# Patient Record
Sex: Male | Born: 1975 | Race: Black or African American | Hispanic: No | Marital: Married | Smoking: Former smoker
Health system: Southern US, Community
[De-identification: ages and names within clinical notes are randomized; demographics above are authoritative.]

## PROBLEM LIST (undated history)

## (undated) DIAGNOSIS — G4730 Sleep apnea, unspecified: Secondary | ICD-10-CM

## (undated) DIAGNOSIS — J45909 Unspecified asthma, uncomplicated: Secondary | ICD-10-CM

## (undated) DIAGNOSIS — M109 Gout, unspecified: Secondary | ICD-10-CM

## (undated) DIAGNOSIS — E785 Hyperlipidemia, unspecified: Secondary | ICD-10-CM

## (undated) HISTORY — DX: Gout, unspecified: M10.9

---

## 1998-03-13 ENCOUNTER — Emergency Department (HOSPITAL_COMMUNITY): Admission: EM | Admit: 1998-03-13 | Discharge: 1998-03-13 | Payer: Self-pay | Admitting: Emergency Medicine

## 1998-04-15 ENCOUNTER — Emergency Department (HOSPITAL_COMMUNITY): Admission: EM | Admit: 1998-04-15 | Discharge: 1998-04-15 | Payer: Self-pay | Admitting: Emergency Medicine

## 1998-10-08 ENCOUNTER — Emergency Department (HOSPITAL_COMMUNITY): Admission: EM | Admit: 1998-10-08 | Discharge: 1998-10-08 | Payer: Self-pay | Admitting: Emergency Medicine

## 1998-10-08 ENCOUNTER — Encounter: Payer: Self-pay | Admitting: Emergency Medicine

## 2010-09-05 ENCOUNTER — Emergency Department: Payer: Self-pay | Admitting: Emergency Medicine

## 2015-05-05 ENCOUNTER — Encounter (HOSPITAL_COMMUNITY): Payer: Self-pay | Admitting: *Deleted

## 2015-05-05 ENCOUNTER — Emergency Department (HOSPITAL_COMMUNITY)
Admission: EM | Admit: 2015-05-05 | Discharge: 2015-05-05 | Disposition: A | Discharge status: Home / Self Care | Payer: Self-pay | Attending: Emergency Medicine | Admitting: Emergency Medicine

## 2015-05-05 ENCOUNTER — Emergency Department (HOSPITAL_COMMUNITY): Payer: Self-pay

## 2015-05-05 DIAGNOSIS — Z7982 Long term (current) use of aspirin: Secondary | ICD-10-CM | POA: Insufficient documentation

## 2015-05-05 DIAGNOSIS — R079 Chest pain, unspecified: Secondary | ICD-10-CM | POA: Insufficient documentation

## 2015-05-05 DIAGNOSIS — R002 Palpitations: Secondary | ICD-10-CM | POA: Insufficient documentation

## 2015-05-05 DIAGNOSIS — Z8719 Personal history of other diseases of the digestive system: Secondary | ICD-10-CM | POA: Insufficient documentation

## 2015-05-05 DIAGNOSIS — R000 Tachycardia, unspecified: Secondary | ICD-10-CM | POA: Insufficient documentation

## 2015-05-05 DIAGNOSIS — Z87891 Personal history of nicotine dependence: Secondary | ICD-10-CM | POA: Insufficient documentation

## 2015-05-05 DIAGNOSIS — Z8639 Personal history of other endocrine, nutritional and metabolic disease: Secondary | ICD-10-CM | POA: Insufficient documentation

## 2015-05-05 DIAGNOSIS — E785 Hyperlipidemia, unspecified: Secondary | ICD-10-CM | POA: Insufficient documentation

## 2015-05-05 HISTORY — DX: Hyperlipidemia, unspecified: E78.5

## 2015-05-05 LAB — I-STAT TROPONIN, ED
TROPONIN I, POC: 0 ng/mL (ref 0.00–0.08)
Troponin i, poc: 0 ng/mL (ref 0.00–0.08)

## 2015-05-05 LAB — BASIC METABOLIC PANEL
Anion gap: 10 (ref 5–15)
BUN: 13 mg/dL (ref 6–20)
CO2: 25 mmol/L (ref 22–32)
Calcium: 9.1 mg/dL (ref 8.9–10.3)
Chloride: 104 mmol/L (ref 101–111)
Creatinine, Ser: 1.05 mg/dL (ref 0.61–1.24)
GFR calc Af Amer: >60 mL/min (ref >60)
GFR calc non Af Amer: >60 mL/min (ref >60)
Glucose, Bld: 91 mg/dL (ref 65–99)
Potassium: 4.1 mmol/L (ref 3.5–5.1)
Sodium: 139 mmol/L (ref 135–145)

## 2015-05-05 LAB — CBC
HCT: 43.3 % (ref 39.0–52.0)
Hemoglobin: 14.3 g/dL (ref 13.0–17.0)
MCH: 30.8 pg (ref 26.0–34.0)
MCHC: 33 g/dL (ref 30.0–36.0)
MCV: 93.3 fL (ref 78.0–100.0)
Platelets: 239 10*3/uL (ref 150–400)
RBC: 4.64 MIL/uL (ref 4.22–5.81)
RDW: 13 % (ref 11.5–15.5)
WBC: 8.5 10*3/uL (ref 4.0–10.5)

## 2015-05-05 LAB — D-DIMER, QUANTITATIVE (NOT AT ARMC)

## 2015-05-05 NOTE — Discharge Instructions (Signed)

## 2015-05-05 NOTE — ED Notes (Signed)
Pt verbalized understanding of d/c instructions and has no further questions. Pt stable and NAD.  
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
## 2015-05-05 NOTE — ED Provider Notes (Signed)
CSN: [id number]     Arrival date & time 05/05/15  1717 History   First MD Initiated Contact with Patient 05/05/15 1725     Chief Complaint  Patient presents with  . Chest Pain     (Consider location/radiation/quality/duration/timing/severity/associated sxs/prior Treatment) HPI Comments: Patient presents to the ED with a chief complaint of heart palpitations.  States that he had an episode of brief palpitations yesterday.  States that they returned today and were accompanied with some mild left sided chest pain.  He denies any SOB, radiating pain, or diaphoresis.  Denies fevers, chills, or cough.  Denies hx of PE/DVT or ACS.  There are no aggravating or alleviating factors.  Symptoms came on randomly.  The history is provided by the patient. No language interpreter was used.    Past Medical History  Diagnosis Date  . Hyperlipidemia    History reviewed. No pertinent past surgical history. History reviewed. No pertinent family history. Social History  Substance Use Topics  . Smoking status: Former Smoker    Quit date: 05/04/2014  . Smokeless tobacco: Never Used  . Alcohol Use: Yes     Comment: last drink 3-4 days ago    Review of Systems  Constitutional: Negative for fever and chills.  Respiratory: Negative for shortness of breath.   Cardiovascular: Positive for chest pain and palpitations.  Gastrointestinal: Negative for nausea, vomiting, diarrhea and constipation.  Genitourinary: Negative for dysuria.  All other systems reviewed and are negative.     Allergies  Review of patient's allergies indicates no known allergies.  Home Medications   Prior to Admission medications   Medication Sig Start Date End Date Taking? Authorizing Provider  aspirin 325 MG tablet Take 325 mg by mouth every 6 (six) hours as needed for moderate pain.   Yes Historical Provider, MD  colchicine 0.6 MG tablet Take 0.6 mg by mouth every other day.   Yes Historical Provider, MD  ibuprofen  (ADVIL,MOTRIN) 200 MG tablet Take 600 mg by mouth every 6 (six) hours as needed for moderate pain.   Yes Historical Provider, MD   BP 107/59 mmHg  Pulse 82  Temp(Src) 97.9 F (36.6 C) (Oral)  Resp 14  SpO2 97% Physical Exam  Constitutional: He is oriented to person, place, and time. He appears well-developed and well-nourished.  HENT:  Head: Normocephalic and atraumatic.  Eyes: Conjunctivae and EOM are normal. Pupils are equal, round, and reactive to light. Right eye exhibits no discharge. Left eye exhibits no discharge. No scleral icterus.  Neck: Normal range of motion. Neck supple. No JVD present.  Cardiovascular: Normal rate, regular rhythm and normal heart sounds.  Exam reveals no gallop and no friction rub.   No murmur heard. Pulmonary/Chest: Effort normal and breath sounds normal. No respiratory distress. He has no wheezes. He has no rales. He exhibits no tenderness.  Abdominal: Soft. He exhibits no distension and no mass. There is no tenderness. There is no rebound and no guarding.  Musculoskeletal: Normal range of motion. He exhibits no edema or tenderness.  Neurological: He is alert and oriented to person, place, and time.  Skin: Skin is warm and dry.  Psychiatric: He has a normal mood and affect. His behavior is normal. Judgment and thought content normal.  Nursing note and vitals reviewed.   ED Course  Procedures (including critical care time) Results for orders placed or performed during the hospital encounter of 05/05/15  CBC  Result Value Ref Range   WBC 8.5 4.0 - 10.5  K/uL   RBC 4.64 4.22 - 5.81 MIL/uL   Hemoglobin 14.3 13.0 - 17.0 g/dL   HCT 16.143.3 09.639.0 - 04.552.0 %   MCV 93.3 78.0 - 100.0 fL   MCH 30.8 26.0 - 34.0 pg   MCHC 33.0 30.0 - 36.0 g/dL   RDW 40.913.0 81.111.5 - 91.415.5 %   Platelets 239 150 - 400 K/uL  Basic metabolic panel  Result Value Ref Range   Sodium 139 135 - 145 mmol/L   Potassium 4.1 3.5 - 5.1 mmol/L   Chloride 104 101 - 111 mmol/L   CO2 25 22 - 32  mmol/L   Glucose, Bld 91 65 - 99 mg/dL   BUN 13 6 - 20 mg/dL   Creatinine, Ser 7.821.05 0.61 - 1.24 mg/dL   Calcium 9.1 8.9 - 95.610.3 mg/dL   GFR calc non Af Amer >60 >60 mL/min   GFR calc Af Amer >60 >60 mL/min   Anion gap 10 5 - 15  D-dimer, quantitative (not at [facility])  Result Value Ref Range   D-Dimer, Quant <0.27 0.00 - 0.50 ug/mL-FEU  I-stat troponin, ED  Result Value Ref Range   Troponin i, poc 0.00 0.00 - 0.08 ng/mL   Comment 3          I-stat troponin, ED  Result Value Ref Range   Troponin i, poc 0.00 0.00 - 0.08 ng/mL   Comment 3           Dg Chest 2 View  05/05/2015  CLINICAL DATA:  Left side chest pain, shortness of breath, history of asthma EXAM: CHEST  2 VIEW COMPARISON:  09/05/2010 FINDINGS: Cardiomediastinal silhouette is stable. No acute infiltrate or pleural effusion. No pulmonary edema. Bony thorax is unremarkable. IMPRESSION: No active cardiopulmonary disease. Electronically Signed   By: Natasha MeadLiviu  Pop M.D.   On: 05/05/2015 18:26     Imaging Review Dg Chest 2 View  05/05/2015  CLINICAL DATA:  Left side chest pain, shortness of breath, history of asthma EXAM: CHEST  2 VIEW COMPARISON:  09/05/2010 FINDINGS: Cardiomediastinal silhouette is stable. No acute infiltrate or pleural effusion. No pulmonary edema. Bony thorax is unremarkable. IMPRESSION: No active cardiopulmonary disease. Electronically Signed   By: Natasha MeadLiviu  Pop M.D.   On: 05/05/2015 18:26   I have personally reviewed and evaluated these images and lab results as part of my medical decision-making.   EKG Interpretation None      MDM   Final diagnoses:  Palpitations  Chest pain, unspecified chest pain type    Patient with palpitations and brief episode of CP.  Low risk for ACS.  HEART score is 2.  D-dimer negative.  Low suspicion for PE.  Patient discussed and EKG reviewed with Dr. Rubin PayorPickering, no ischemic changes.  Only mildly tachycardic.  Patient does have a hx of reflux and states that this feels similar.   Could be a component of anxiety.  Plan for discharge to home.  Delta trop negative.  Recommend outpatient follow-up.    Roxy HorsemanRobert Michalene Debruler, PA-C 05/05/15 2117  Benjiman CoreNathan Pickering, MD 05/05/15 [phone]

## 2015-05-05 NOTE — ED Notes (Signed)
Patient transported to X-ray 

## 2015-05-05 NOTE — ED Notes (Signed)
MD at bedside. 

## 2017-02-05 ENCOUNTER — Encounter (HOSPITAL_COMMUNITY): Payer: Self-pay | Admitting: Emergency Medicine

## 2017-02-05 ENCOUNTER — Emergency Department (HOSPITAL_COMMUNITY): Payer: No Typology Code available for payment source

## 2017-02-05 ENCOUNTER — Emergency Department (HOSPITAL_COMMUNITY)
Admission: EM | Admit: 2017-02-05 | Discharge: 2017-02-05 | Disposition: A | Discharge status: Home / Self Care | Payer: No Typology Code available for payment source | Attending: Emergency Medicine | Admitting: Emergency Medicine

## 2017-02-05 DIAGNOSIS — S4991XA Unspecified injury of right shoulder and upper arm, initial encounter: Secondary | ICD-10-CM | POA: Diagnosis present

## 2017-02-05 DIAGNOSIS — E785 Hyperlipidemia, unspecified: Secondary | ICD-10-CM | POA: Insufficient documentation

## 2017-02-05 DIAGNOSIS — J45909 Unspecified asthma, uncomplicated: Secondary | ICD-10-CM | POA: Diagnosis not present

## 2017-02-05 DIAGNOSIS — Y92410 Unspecified street and highway as the place of occurrence of the external cause: Secondary | ICD-10-CM | POA: Insufficient documentation

## 2017-02-05 DIAGNOSIS — Z87891 Personal history of nicotine dependence: Secondary | ICD-10-CM | POA: Diagnosis not present

## 2017-02-05 DIAGNOSIS — S42321A Displaced transverse fracture of shaft of humerus, right arm, initial encounter for closed fracture: Secondary | ICD-10-CM | POA: Insufficient documentation

## 2017-02-05 DIAGNOSIS — Y998 Other external cause status: Secondary | ICD-10-CM | POA: Diagnosis not present

## 2017-02-05 DIAGNOSIS — Y939 Activity, unspecified: Secondary | ICD-10-CM | POA: Diagnosis not present

## 2017-02-05 HISTORY — DX: Unspecified asthma, uncomplicated: J45.909

## 2017-02-05 MED ORDER — OXYCODONE-ACETAMINOPHEN 5-325 MG PO TABS: 1.0000 | ORAL_TABLET | Freq: Four times a day (QID) | ORAL | 0 refills | Status: DC | PRN

## 2017-02-05 NOTE — Discharge Instructions (Signed)
Return here as needed.  Call the orthopedist provided tomorrow morning for an appointment for the beginning of next week
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
## 2017-02-05 NOTE — Progress Notes (Signed)
Orthopedic Tech Progress Note Patient Details:  Mattie MarlinMichael H Cansler Aug 25, 1975 [id number]  Ortho Devices Type of Ortho Device: Ace wrap, Coapt, Shoulder immobilizer Ortho Device/Splint Interventions: Application   Saul FordyceJennifer C Glora Hulgan 02/05/2017, 4:43 PM

## 2017-02-12 ENCOUNTER — Encounter (HOSPITAL_BASED_OUTPATIENT_CLINIC_OR_DEPARTMENT_OTHER): Payer: Self-pay | Admitting: *Deleted

## 2017-02-13 NOTE — H&P (Signed)
PREOPERATIVE H&P  Chief Complaint: RIGHT HUMERUS SHAFT FRACTURE  HPI: Derek Lee is a 41 y.o. male who presents for preoperative history and physical with a diagnosis of RIGHT HUMERUS SHAFT FRACTURE. Symptoms are rated as moderate to severe, and have been worsening.  This is significantly impairing activities of daily living.  He has elected for surgical management.   Past Medical History:  Diagnosis Date  . Asthma   . Hyperlipidemia   . Sleep apnea    History reviewed. No pertinent surgical history. Social History   Social History  . Marital status: Married    Spouse name: N/A  . Number of children: N/A  . Years of education: N/A   Social History Main Topics  . Smoking status: Former Smoker    Quit date: 05/04/2014  . Smokeless tobacco: Never Used  . Alcohol use Yes     Comment: last drink 3-4 days ago  . Drug use: No  . Sexual activity: Not Asked   Other Topics Concern  . None   Social History Narrative  . None   History reviewed. No pertinent family history. No Known Allergies Prior to Admission medications   Medication Sig Start Date End Date Taking? Authorizing Provider  ibuprofen (ADVIL,MOTRIN) 200 MG tablet Take 600 mg by mouth every 6 (six) hours as needed for moderate pain.   Yes [provider]  oxyCODONE-acetaminophen (PERCOCET/ROXICET) 5-325 MG tablet Take 1 tablet by mouth every 6 (six) hours as needed for severe pain. 02/05/17  Yes Lawyer, Cristal Deer, PA-C     Positive ROS: All other systems have been reviewed and were otherwise negative with the exception of those mentioned in the HPI and as above.  Physical Exam: General: Alert, no acute distress Cardiovascular: No pedal edema Respiratory: No cyanosis, no use of accessory musculature GI: No organomegaly, abdomen is soft and non-tender Skin: No lesions in the area of chief complaint Neurologic: Sensation intact distally Psychiatric: Patient is competent for consent with normal mood  and affect Lymphatic: No axillary or cervical lymphadenopathy  MUSCULOSKELETAL: R humerus in splint, intact distal motor and sensory including Radial nerve function.  WWP hand.  Assessment: RIGHT HUMERUS SHAFT FRACTURE  Plan: Plan for Procedure(s): OPEN REDUCTION INTERNAL FIXATION (ORIF) RIGHT HUMERAL SHAFT FRACTURE  The risks benefits and alternatives were discussed with the patient including but not limited to the risks of nonoperative treatment, versus surgical intervention including infection, bleeding, nerve injury,  blood clots, cardiopulmonary complications, morbidity, mortality, among others, and they were willing to proceed.   [name], MD  02/13/2017 1:14 PM

## 2017-02-16 ENCOUNTER — Encounter (HOSPITAL_BASED_OUTPATIENT_CLINIC_OR_DEPARTMENT_OTHER): Payer: Self-pay | Admitting: Certified Registered"

## 2017-02-16 ENCOUNTER — Ambulatory Visit (HOSPITAL_BASED_OUTPATIENT_CLINIC_OR_DEPARTMENT_OTHER): Payer: No Typology Code available for payment source | Admitting: Anesthesiology

## 2017-02-16 ENCOUNTER — Encounter (HOSPITAL_BASED_OUTPATIENT_CLINIC_OR_DEPARTMENT_OTHER)
Admission: RE | Disposition: A | Discharge status: Home / Self Care | Payer: Self-pay | Source: Ambulatory Visit | Attending: Orthopaedic Surgery

## 2017-02-16 ENCOUNTER — Ambulatory Visit (HOSPITAL_BASED_OUTPATIENT_CLINIC_OR_DEPARTMENT_OTHER)
Admission: RE | Admit: 2017-02-16 | Discharge: 2017-02-16 | Disposition: A | Discharge status: Home / Self Care | Payer: No Typology Code available for payment source | Source: Ambulatory Visit | Attending: Orthopaedic Surgery | Admitting: Orthopaedic Surgery

## 2017-02-16 DIAGNOSIS — Z6841 Body Mass Index (BMI) 40.0 and over, adult: Secondary | ICD-10-CM | POA: Insufficient documentation

## 2017-02-16 DIAGNOSIS — S42301A Unspecified fracture of shaft of humerus, right arm, initial encounter for closed fracture: Secondary | ICD-10-CM | POA: Insufficient documentation

## 2017-02-16 DIAGNOSIS — E785 Hyperlipidemia, unspecified: Secondary | ICD-10-CM | POA: Insufficient documentation

## 2017-02-16 DIAGNOSIS — X58XXXA Exposure to other specified factors, initial encounter: Secondary | ICD-10-CM | POA: Insufficient documentation

## 2017-02-16 DIAGNOSIS — G4730 Sleep apnea, unspecified: Secondary | ICD-10-CM | POA: Insufficient documentation

## 2017-02-16 DIAGNOSIS — J45909 Unspecified asthma, uncomplicated: Secondary | ICD-10-CM | POA: Insufficient documentation

## 2017-02-16 DIAGNOSIS — Z87891 Personal history of nicotine dependence: Secondary | ICD-10-CM | POA: Diagnosis not present

## 2017-02-16 HISTORY — DX: Sleep apnea, unspecified: G47.30

## 2017-02-16 HISTORY — PX: ORIF HUMERUS FRACTURE: SHX2126

## 2017-02-16 SURGERY — OPEN REDUCTION INTERNAL FIXATION (ORIF) HUMERAL SHAFT FRACTURE
Anesthesia: General | Site: Arm Upper | Laterality: Right

## 2017-02-16 MED ORDER — BUPIVACAINE HCL (PF) 0.5 % IJ SOLN
INTRAMUSCULAR | Status: AC
  Filled 2017-02-16: qty 30

## 2017-02-16 MED ORDER — MIDAZOLAM HCL 2 MG/2ML IJ SOLN
1.0000 mg | INTRAMUSCULAR | Status: DC | PRN
  Administered 2017-02-16: 2 mg via INTRAVENOUS

## 2017-02-16 MED ORDER — LIDOCAINE HCL (CARDIAC) 20 MG/ML IV SOLN
INTRAVENOUS | Status: DC | PRN
  Administered 2017-02-16: 30 mg via INTRAVENOUS

## 2017-02-16 MED ORDER — FENTANYL CITRATE (PF) 100 MCG/2ML IJ SOLN
INTRAMUSCULAR | Status: AC
  Filled 2017-02-16: qty 2

## 2017-02-16 MED ORDER — BISACODYL 5 MG PO TBEC: 5.0000 mg | DELAYED_RELEASE_TABLET | Freq: Every day | ORAL | 0 refills | Status: AC | PRN

## 2017-02-16 MED ORDER — LACTATED RINGERS IV SOLN: INTRAVENOUS | Status: DC

## 2017-02-16 MED ORDER — CHLORHEXIDINE GLUCONATE 4 % EX LIQD: 60.0000 mL | Freq: Once | CUTANEOUS | Status: DC

## 2017-02-16 MED ORDER — CEFAZOLIN SODIUM-DEXTROSE 2-4 GM/100ML-% IV SOLN
INTRAVENOUS | Status: AC
Start: 2017-02-16 — End: 2017-02-16
  Filled 2017-02-16: qty 100

## 2017-02-16 MED ORDER — FENTANYL CITRATE (PF) 100 MCG/2ML IJ SOLN: 25.0000 ug | INTRAMUSCULAR | Status: DC | PRN

## 2017-02-16 MED ORDER — CEFAZOLIN SODIUM-DEXTROSE 2-4 GM/100ML-% IV SOLN
2.0000 g | INTRAVENOUS | Status: AC
  Administered 2017-02-16: 2 g via INTRAVENOUS

## 2017-02-16 MED ORDER — DEXAMETHASONE SODIUM PHOSPHATE 10 MG/ML IJ SOLN
INTRAMUSCULAR | Status: DC | PRN
  Administered 2017-02-16: 10 mg via INTRAVENOUS

## 2017-02-16 MED ORDER — METOCLOPRAMIDE HCL 5 MG/ML IJ SOLN: 10.0000 mg | Freq: Once | INTRAMUSCULAR | Status: DC | PRN

## 2017-02-16 MED ORDER — FENTANYL CITRATE (PF) 100 MCG/2ML IJ SOLN
50.0000 ug | INTRAMUSCULAR | Status: AC | PRN
  Administered 2017-02-16: 25 ug via INTRAVENOUS
  Administered 2017-02-16: 100 ug via INTRAVENOUS
  Administered 2017-02-16: 25 ug via INTRAVENOUS

## 2017-02-16 MED ORDER — BUPIVACAINE HCL (PF) 0.25 % IJ SOLN
INTRAMUSCULAR | Status: AC
  Filled 2017-02-16: qty 60

## 2017-02-16 MED ORDER — MEPERIDINE HCL 25 MG/ML IJ SOLN: 6.2500 mg | INTRAMUSCULAR | Status: DC | PRN

## 2017-02-16 MED ORDER — MIDAZOLAM HCL 2 MG/2ML IJ SOLN
INTRAMUSCULAR | Status: AC
  Filled 2017-02-16: qty 2

## 2017-02-16 MED ORDER — BUPIVACAINE-EPINEPHRINE (PF) 0.5% -1:200000 IJ SOLN
INTRAMUSCULAR | Status: DC | PRN
  Administered 2017-02-16: 30 mL via PERINEURAL

## 2017-02-16 MED ORDER — ONDANSETRON HCL 4 MG PO TABS: 4.0000 mg | ORAL_TABLET | Freq: Three times a day (TID) | ORAL | 1 refills | Status: AC | PRN

## 2017-02-16 MED ORDER — ACETAMINOPHEN 500 MG PO TABS: 1000.0000 mg | ORAL_TABLET | Freq: Three times a day (TID) | ORAL | 0 refills | Status: AC

## 2017-02-16 MED ORDER — CEFAZOLIN SODIUM-DEXTROSE 1-4 GM/50ML-% IV SOLN
INTRAVENOUS | Status: AC
  Filled 2017-02-16: qty 50

## 2017-02-16 MED ORDER — LACTATED RINGERS IV SOLN
INTRAVENOUS | Status: DC
  Administered 2017-02-16 (×2): via INTRAVENOUS

## 2017-02-16 MED ORDER — OXYCODONE HCL 5 MG PO TABS: ORAL_TABLET | ORAL | 0 refills | Status: AC

## 2017-02-16 MED ORDER — PROPOFOL 10 MG/ML IV BOLUS
INTRAVENOUS | Status: DC | PRN
  Administered 2017-02-16: 200 mg via INTRAVENOUS

## 2017-02-16 MED ORDER — SCOPOLAMINE 1 MG/3DAYS TD PT72
1.0000 | MEDICATED_PATCH | Freq: Once | TRANSDERMAL | Status: DC | PRN
Start: 2017-02-16 — End: 2017-02-16

## 2017-02-16 SURGICAL SUPPLY — 63 items
8 hole plate (Plate) ×1 IMPLANT
APL SKNCLS STERI-STRIP NONHPOA (GAUZE/BANDAGES/DRESSINGS) ×1
BANDAGE ACE 4X5 VEL STRL LF (GAUZE/BANDAGES/DRESSINGS) ×2 IMPLANT
BENZOIN TINCTURE PRP APPL 2/3 (GAUZE/BANDAGES/DRESSINGS) ×2 IMPLANT
BIT DRILL Q/COUPLING 1 (BIT) ×1 IMPLANT
BLADE SURG 15 STRL LF DISP TIS (BLADE) ×1 IMPLANT
BLADE SURG 15 STRL SS (BLADE) ×2
BNDG CMPR 9X4 STRL LF SNTH (GAUZE/BANDAGES/DRESSINGS) ×1
BNDG ESMARK 4X9 LF (GAUZE/BANDAGES/DRESSINGS) ×2 IMPLANT
CHLORAPREP W/TINT 26ML (MISCELLANEOUS) ×2 IMPLANT
COVER BACK TABLE 60X90IN (DRAPES) IMPLANT
COVER MAYO STAND STRL (DRAPES) IMPLANT
DECANTER SPIKE VIAL GLASS SM (MISCELLANEOUS) IMPLANT
DRAPE C-ARM 42X72 X-RAY (DRAPES) ×2 IMPLANT
DRAPE EXTREMITY T 121X128X90 (DRAPE) IMPLANT
DRAPE IMP U-DRAPE 54X76 (DRAPES) IMPLANT
DRAPE INCISE IOBAN 66X45 STRL (DRAPES) ×2 IMPLANT
DRAPE OEC MINIVIEW 54X84 (DRAPES) ×1 IMPLANT
DRAPE U-SHAPE 47X51 STRL (DRAPES) ×2 IMPLANT
ELECT REM PT RETURN 9FT ADLT (ELECTROSURGICAL) ×2
ELECTRODE REM PT RTRN 9FT ADLT (ELECTROSURGICAL) ×1 IMPLANT
GAUZE SPONGE 4X4 12PLY STRL (GAUZE/BANDAGES/DRESSINGS) ×2 IMPLANT
GAUZE XEROFORM 1X8 LF (GAUZE/BANDAGES/DRESSINGS) ×2 IMPLANT
GAUZE XEROFORM 5X9 LF (GAUZE/BANDAGES/DRESSINGS) IMPLANT
GLOVE BIOGEL PI IND STRL 8 (GLOVE) ×1 IMPLANT
GLOVE BIOGEL PI INDICATOR 8 (GLOVE) ×1
GLOVE SURG ORTHO 8.0 STRL STRW (GLOVE) ×2 IMPLANT
GOWN STRL REUS W/ TWL LRG LVL3 (GOWN DISPOSABLE) ×2 IMPLANT
GOWN STRL REUS W/TWL LRG LVL3 (GOWN DISPOSABLE) ×4
GOWN STRL REUS W/TWL XL LVL3 (GOWN DISPOSABLE) ×2 IMPLANT
NS IRRIG 1000ML POUR BTL (IV SOLUTION) ×2 IMPLANT
PACK ARTHROSCOPY DSU (CUSTOM PROCEDURE TRAY) ×2 IMPLANT
PACK BASIN DAY SURGERY FS (CUSTOM PROCEDURE TRAY) ×2 IMPLANT
PAD CAST 4YDX4 CTTN HI CHSV (CAST SUPPLIES) ×1 IMPLANT
PADDING CAST COTTON 4X4 STRL (CAST SUPPLIES) ×2
PENCIL BUTTON HOLSTER BLD 10FT (ELECTRODE) ×2 IMPLANT
SCREW CORTEX ST 4.5X26 (Screw) ×2 IMPLANT
SCREW CORTEX ST 4.5X28 (Screw) ×5 IMPLANT
SCREW CORTEX ST 4.5X32 (Screw) ×1 IMPLANT
SLEEVE SCD COMPRESS KNEE MED (MISCELLANEOUS) ×1 IMPLANT
SLING ARM FOAM STRAP LRG (SOFTGOODS) ×2 IMPLANT
SLING ARM IMMOBILIZER LRG (SOFTGOODS) IMPLANT
SLING ARM IMMOBILIZER MED (SOFTGOODS) IMPLANT
SLING ARM MED ADULT FOAM STRAP (SOFTGOODS) IMPLANT
SLING ARM XL FOAM STRAP (SOFTGOODS) IMPLANT
SPLINT FAST PLASTER 5X30 (CAST SUPPLIES)
SPLINT PLASTER CAST FAST 5X30 (CAST SUPPLIES) ×10 IMPLANT
SPONGE LAP 18X18 X RAY DECT (DISPOSABLE) ×4 IMPLANT
STAPLER VISISTAT 35W (STAPLE) IMPLANT
STRIP CLOSURE SKIN 1/2X4 (GAUZE/BANDAGES/DRESSINGS) ×1 IMPLANT
SUCTION FRAZIER HANDLE 10FR (MISCELLANEOUS)
SUCTION TUBE FRAZIER 10FR DISP (MISCELLANEOUS) IMPLANT
SUT ETHILON 3 0 PS 1 (SUTURE) IMPLANT
SUT VIC AB 0 CT1 27 (SUTURE) ×2
SUT VIC AB 0 CT1 27XBRD ANBCTR (SUTURE) ×1 IMPLANT
SUT VIC AB 2-0 SH 27 (SUTURE) ×4
SUT VIC AB 2-0 SH 27XBRD (SUTURE) ×2 IMPLANT
SUT VIC AB 3-0 SH 27 (SUTURE)
SUT VIC AB 3-0 SH 27X BRD (SUTURE) IMPLANT
SYR BULB 3OZ (MISCELLANEOUS) ×2 IMPLANT
TOWEL OR 17X24 6PK STRL BLUE (TOWEL DISPOSABLE) ×2 IMPLANT
TOWEL OR NON WOVEN STRL DISP B (DISPOSABLE) ×2 IMPLANT
YANKAUER SUCT BULB TIP NO VENT (SUCTIONS) ×2 IMPLANT
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
## 2017-02-16 NOTE — Anesthesia Preprocedure Evaluation (Signed)
Anesthesia Evaluation  Patient identified by MRN, date of birth, ID band Patient awake    Reviewed: Allergy & Precautions, NPO status   Airway        Dental   Pulmonary asthma , sleep apnea , former smoker,           Cardiovascular      Neuro/Psych    GI/Hepatic   Endo/Other  Morbid obesity  Renal/GU      Musculoskeletal   Abdominal   Peds  Hematology   Anesthesia Other Findings   Reproductive/Obstetrics                             Anesthesia Physical Anesthesia Plan Anesthesia Quick Evaluation

## 2017-02-16 NOTE — Anesthesia Preprocedure Evaluation (Signed)
Anesthesia Evaluation  Patient identified by MRN, date of birth, ID band Patient awake    Reviewed: Allergy & Precautions, NPO status , Patient's Chart, lab work & pertinent test results  Airway Mallampati: III  TM Distance: >3 FB Neck ROM: Full    Dental no notable dental hx.    Pulmonary asthma , sleep apnea , former smoker,    Pulmonary exam normal breath sounds clear to auscultation       Cardiovascular negative cardio ROS Normal cardiovascular exam Rhythm:Regular Rate:Normal     Neuro/Psych negative neurological ROS  negative psych ROS   GI/Hepatic negative GI ROS, Neg liver ROS,   Endo/Other  Morbid obesity  Renal/GU negative Renal ROS  negative genitourinary   Musculoskeletal negative musculoskeletal ROS (+)   Abdominal   Peds negative pediatric ROS (+)  Hematology negative hematology ROS (+)   Anesthesia Other Findings   Reproductive/Obstetrics negative OB ROS                             Anesthesia Physical Anesthesia Plan  ASA: III  Anesthesia Plan: General   Post-op Pain Management:  Regional for Post-op pain   Induction: Intravenous  PONV Risk Score and Plan: 2 and Ondansetron, Dexamethasone and Treatment may vary due to age or medical condition  Airway Management Planned: LMA  Additional Equipment:   Intra-op Plan:   Post-operative Plan: Extubation in OR  Informed Consent: I have reviewed the patients History and Physical, chart, labs and discussed the procedure including the risks, benefits and alternatives for the proposed anesthesia with the patient or authorized representative who has indicated his/her understanding and acceptance.   Dental advisory given  Plan Discussed with: CRNA  Anesthesia Plan Comments:         Anesthesia Quick Evaluation

## 2017-02-16 NOTE — Progress Notes (Signed)
Assisted Dr. Carignan with right, ultrasound guided, supraclavicular block. Side rails up, monitors on throughout procedure. See vital signs in flow sheet. Tolerated Procedure well. 

## 2017-02-16 NOTE — Anesthesia Procedure Notes (Signed)
Anesthesia Regional Block: Supraclavicular block   Pre-Anesthetic Checklist: ,, timeout performed, Correct Patient, Correct Site, Correct Laterality, Correct Procedure, Correct Position, site marked, Risks and benefits discussed,  Surgical consent,  Pre-op evaluation,  At surgeon's request and post-op pain management  Laterality: Right and Upper  Prep: Maximum Sterile Barrier Precautions used, chloraprep       Needles:  Injection technique: Single-shot  Needle Type: Echogenic Stimulator Needle     Needle Length: 10cm      Additional Needles:   Procedures:,,,, ultrasound used (permanent image in chart),,,,  Narrative:  Start time: 02/16/2017 7:14 AM End time: 02/16/2017 7:24 AM Injection made incrementally with aspirations every 5 mL.  Performed by: Personally  Anesthesiologist: Phillips Grout  Additional Notes: Risks, benefits and alternative to block explained extensively.  Patient tolerated procedure well, without complications.

## 2017-02-16 NOTE — Anesthesia Procedure Notes (Signed)
Procedure Name: LMA Insertion Date/Time: 02/16/2017 7:36 AM Performed by: Ferrell Claiborne D Pre-anesthesia Checklist: Patient identified, Emergency Drugs available, Suction available and Patient being monitored Patient Re-evaluated:Patient Re-evaluated prior to induction Oxygen Delivery Method: Circle system utilized Preoxygenation: Pre-oxygenation with 100% oxygen Induction Type: IV induction Ventilation: Mask ventilation without difficulty LMA: LMA inserted LMA Size: 5.0 Number of attempts: 1 Airway Equipment and Method: Bite block Placement Confirmation: positive ETCO2 Tube secured with: Tape Dental Injury: Teeth and Oropharynx as per pre-operative assessment

## 2017-02-16 NOTE — Interval H&P Note (Signed)
History and Physical Interval Note:  02/16/2017 6:54 AM  Derek Lee  has presented today for surgery, with the diagnosis of RIGHT HUMERUS SHAFT FRACTURE  The various methods of treatment have been discussed with the patient and family. After consideration of risks, benefits and other options for treatment, the patient has consented to  Procedure(s): OPEN REDUCTION INTERNAL FIXATION (ORIF) RIGHT HUMERAL SHAFT FRACTURE (Right) as a surgical intervention .  The patient's history has been reviewed, patient examined, no change in status, stable for surgery.  I have reviewed the patient's chart and labs.  Questions were answered to the patient's satisfaction.     [name]

## 2017-02-16 NOTE — Discharge Instructions (Signed)
Regional Anesthesia Blocks  1. Numbness or the inability to move the "blocked" extremity may last from 3-48 hours after placement. The length of time depends on the medication injected and your individual response to the medication. If the numbness is not going away after 48 hours, call your surgeon.  2. The extremity that is blocked will need to be protected until the numbness is gone and the  Strength has returned. Because you cannot feel it, you will need to take extra care to avoid injury. Because it may be weak, you may have difficulty moving it or using it. You may not know what position it is in without looking at it while the block is in effect.  3. For blocks in the legs and feet, returning to weight bearing and walking needs to be done carefully. You will need to wait until the numbness is entirely gone and the strength has returned. You should be able to move your leg and foot normally before you try and bear weight or walk. You will need someone to be with you when you first try to ensure you do not fall and possibly risk injury.  4. Bruising and tenderness at the needle site are common side effects and will resolve in a few days.  5. Persistent numbness or new problems with movement should be communicated to the surgeon or the [facility] ([phone])/ [facility] ([phone]).  Post Anesthesia Home Care Instructions  Activity: Get plenty of rest for the remainder of the day. A responsible individual must stay with you for 24 hours following the procedure.  For the next 24 hours, DO NOT: -Drive a car -Operate machinery -Drink alcoholic beverages -Take any medication unless instructed by your physician -Make any legal decisions or sign important papers.  Meals: Start with liquid foods such as gelatin or soup. Progress to regular foods as tolerated. Avoid greasy, spicy, heavy foods. If nausea and/or vomiting occur, drink only clear liquids until the  nausea and/or vomiting subsides. Call your physician if vomiting continues.  Special Instructions/Symptoms: Your throat may feel dry or sore from the anesthesia or the breathing tube placed in your throat during surgery. If this causes discomfort, gargle with warm salt water. The discomfort should disappear within 24 hours.  If you had a scopolamine patch placed behind your ear for the management of post- operative nausea and/or vomiting:  1. The medication in the patch is effective for 72 hours, after which it should be removed.  Wrap patch in a tissue and discard in the trash. Wash hands thoroughly with soap and water. 2. You may remove the patch earlier than 72 hours if you experience unpleasant side effects which may include dry mouth, dizziness or visual disturbances. 3. Avoid touching the patch. Wash your hands with soap and water after contact with the patch.      Post Anesthesia Home Care Instructions  Activity: Get plenty of rest for the remainder of the day. A responsible individual must stay with you for 24 hours following the procedure.  For the next 24 hours, DO NOT: -Drive a car -Operate machinery -Drink alcoholic beverages -Take any medication unless instructed by your physician -Make any legal decisions or sign important papers.  Meals: Start with liquid foods such as gelatin or soup. Progress to regular foods as tolerated. Avoid greasy, spicy, heavy foods. If nausea and/or vomiting occur, drink only clear liquids until the nausea and/or vomiting subsides. Call your physician if vomiting continues.    Special Instructions/Symptoms: Your throat may feel dry or sore from the anesthesia or the breathing tube placed in your throat during surgery. If this causes discomfort, gargle with warm salt water. The discomfort should disappear within 24 hours.  If you had a scopolamine patch placed behind your ear for the management of post- operative nausea and/or vomiting:  1. The  medication in the patch is effective for 72 hours, after which it should be removed.  Wrap patch in a tissue and discard in the trash. Wash hands thoroughly with soap and water. 2. You may remove the patch earlier than 72 hours if you experience unpleasant side effects which may include dry mouth, dizziness or visual disturbances. 3. Avoid touching the patch. Wash your hands with soap and water after contact with the patch.     

## 2017-02-16 NOTE — Transfer of Care (Signed)
Immediate Anesthesia Transfer of Care Note  Patient: Derek Lee  Procedure(s) Performed: Procedure(s): OPEN REDUCTION INTERNAL FIXATION (ORIF) RIGHT HUMERAL SHAFT FRACTURE (Right)  Patient Location: PACU  Anesthesia Type:GA combined with regional for post-op pain  Level of Consciousness: awake and patient cooperative  Airway & Oxygen Therapy: Patient Spontanous Breathing and Patient connected to face mask oxygen  Post-op Assessment: Report given to RN and Post -op Vital signs reviewed and stable  Post vital signs: Reviewed and stable  Last Vitals:  Vitals:   02/16/17 0919 02/16/17 0920  BP:  (!) 113/49  Pulse: (!) 105 (!) 105  Resp: (!) 27 (!) 32  Temp:  (P) 37.1 C  SpO2: (!) 88% 90%    Last Pain:  Vitals:   02/16/17 0703  TempSrc: Oral         Complications: No apparent anesthesia complications

## 2017-02-17 ENCOUNTER — Encounter (HOSPITAL_BASED_OUTPATIENT_CLINIC_OR_DEPARTMENT_OTHER): Payer: Self-pay | Admitting: Orthopaedic Surgery

## 2017-02-17 NOTE — Anesthesia Postprocedure Evaluation (Signed)
Anesthesia Post Note  Patient: Derek Lee  Procedure(s) Performed: Procedure(s) (LRB): OPEN REDUCTION INTERNAL FIXATION (ORIF) RIGHT HUMERAL SHAFT FRACTURE (Right)     Patient location during evaluation: PACU Anesthesia Type: General and Regional Level of consciousness: awake and alert Pain management: pain level controlled Vital Signs Assessment: post-procedure vital signs reviewed and stable Respiratory status: spontaneous breathing and respiratory function stable Cardiovascular status: blood pressure returned to baseline and stable Postop Assessment: no headache, no backache and no apparent nausea or vomiting Anesthetic complications: no    Last Vitals:  Vitals:   02/16/17 1030 02/16/17 1100  BP: 131/66 126/82  Pulse: (!) 108 (!) 103  Resp: (!) 23 (!) 22  Temp:  36.7 C  SpO2: 92% 93%    Last Pain:  Vitals:   02/16/17 1100  TempSrc:   PainSc: 0-No pain                 Phillips Grout

## 2017-11-05 ENCOUNTER — Emergency Department (HOSPITAL_COMMUNITY): Payer: Self-pay

## 2017-11-05 ENCOUNTER — Encounter (HOSPITAL_COMMUNITY): Payer: Self-pay

## 2017-11-05 ENCOUNTER — Other Ambulatory Visit: Payer: Self-pay

## 2017-11-05 DIAGNOSIS — Z87891 Personal history of nicotine dependence: Secondary | ICD-10-CM | POA: Insufficient documentation

## 2017-11-05 DIAGNOSIS — M10062 Idiopathic gout, left knee: Secondary | ICD-10-CM | POA: Insufficient documentation

## 2017-11-05 NOTE — ED Triage Notes (Signed)
Pt reports that he was working around the house 2 days ago and now is experiencing L knee pain and swelling. He has a hx of gout.

## 2017-11-06 ENCOUNTER — Emergency Department (HOSPITAL_COMMUNITY)
Admission: EM | Admit: 2017-11-06 | Discharge: 2017-11-06 | Disposition: A | Discharge status: Home / Self Care | Payer: Self-pay | Attending: Emergency Medicine | Admitting: Emergency Medicine

## 2017-11-06 DIAGNOSIS — M10062 Idiopathic gout, left knee: Secondary | ICD-10-CM

## 2017-11-06 MED ORDER — IBUPROFEN 800 MG PO TABS
800.0000 mg | ORAL_TABLET | Freq: Three times a day (TID) | ORAL | 0 refills | Status: AC | PRN
Start: 2017-11-06 — End: ?

## 2017-11-06 MED ORDER — IBUPROFEN 800 MG PO TABS
800.0000 mg | ORAL_TABLET | Freq: Once | ORAL | Status: AC
  Administered 2017-11-06: 800 mg via ORAL
  Filled 2017-11-06: qty 1

## 2017-11-06 MED ORDER — COLCHICINE 0.6 MG PO TABS: ORAL_TABLET | ORAL | 0 refills | Status: DC

## 2017-11-06 NOTE — ED Provider Notes (Signed)
[facility] Provider Note: Lowella Dell, MD, FACEP  CSN: [id number] MRN: [id number] ARRIVAL: 11/05/17 at 2210 ROOM: WA05/WA05   CHIEF COMPLAINT  Knee Pain   HISTORY OF PRESENT ILLNESS  11/06/17 1:17 AM Derek Lee is a 42 y.o. male with as sharp pain in his left knee for the past 2 or 3 days.  There was no specific injury that he is aware of.  The pain has been associated with an effusion.  The pain became severe the evening before yesterday and he took a dose of his father's Colcrys.  He also took thousand milligrams of ibuprofen yesterday afternoon.  He has subsequently had significant improvement in the pain but it is still interfering with his ability to walk.  He has a history of gout in his toes and ankles and he has a strong family history of gout.   Past Medical History:  Diagnosis Date  . Asthma   . Hyperlipidemia   . Sleep apnea     Past Surgical History:  Procedure Laterality Date  . ORIF HUMERUS FRACTURE Right 02/16/2017   Procedure: OPEN REDUCTION INTERNAL FIXATION (ORIF) RIGHT HUMERAL SHAFT FRACTURE;  Surgeon: [name], MD;  Location: [facility];  Service: Orthopedics;  Laterality: Right;    History reviewed. No pertinent family history.  Social History   Tobacco Use  . Smoking status: Former Smoker    Last attempt to quit: 05/04/2014    Years since quitting: 3.5  . Smokeless tobacco: Never Used  Substance Use Topics  . Alcohol use: Yes    Comment: last drink 3-4 days ago  . Drug use: No    Prior to Admission medications   Not on File    Allergies Patient has no known allergies.   REVIEW OF SYSTEMS  Negative except as noted here or in the History of Present Illness.   PHYSICAL EXAMINATION  Initial Vital Signs Blood pressure 131/82, pulse 84, temperature 98.2 F (36.8 C), temperature source Oral, resp. rate 16, SpO2 95 %.  Examination General: Well-developed, well-nourished male in no acute distress;  appearance consistent with age of record HENT: normocephalic; atraumatic Eyes: pupils equal, round and reactive to light; extraocular muscles intact Neck: supple Heart: regular rate and rhythm Lungs: clear to auscultation bilaterally Abdomen: soft; nondistended; nontender; bowel sounds present Extremities: No deformity; pulses normal; mild tenderness of left knee with palpable effusion, no erythema or warmth Neurologic: Awake, alert and oriented; motor function intact in all extremities and symmetric; no facial droop Skin: Warm and dry Psychiatric: Normal mood and affect   RESULTS  Summary of this visit's results, reviewed by myself:   EKG Interpretation  Date/Time:    Ventricular Rate:    PR Interval:    QRS Duration:   QT Interval:    QTC Calculation:   R Axis:     Text Interpretation:        Laboratory Studies: No results found for this or any previous visit (from the past 24 hour(s)). Imaging Studies: Dg Knee Complete 4 Views Left  Result Date: 11/06/2017 CLINICAL DATA:  Left knee pain.  History of gout. EXAM: LEFT KNEE - COMPLETE 4+ VIEW COMPARISON:  None. FINDINGS: No evidence of fracture, dislocation, or joint effusion. No evidence of arthropathy or other focal bone abnormality. Soft tissues are unremarkable. IMPRESSION: Negative. Electronically Signed   By: Deatra Robinson M.D.   On: 11/06/2017 00:15    ED COURSE and MDM  Nursing notes and initial vitals  signs, including pulse oximetry, reviewed.  Vitals:   11/05/17 2252  BP: 131/82  Pulse: 84  Resp: 16  Temp: 98.2 F (36.8 C)  TempSrc: Oral  SpO2: 95%   History and response to Colcrys are consistent with an acute gout flare.  There is no erythema or warmth to suggest a septic joint.  The effusion does not appear large enough that I believe aspiration is indicated at this time.  We will treat for gout.  We will also provide an knee immobilizer to assist with ambulation until symptoms resolve.  PROCEDURES     ED DIAGNOSES     ICD-10-CM   1. Acute idiopathic gout of left knee M10.062        Amaad Byers, Jonny RuizJohn, MD 11/06/17 0131

## 2018-03-23 ENCOUNTER — Ambulatory Visit (HOSPITAL_COMMUNITY)
Admission: EM | Admit: 2018-03-23 | Discharge: 2018-03-23 | Disposition: A | Discharge status: Home / Self Care | Payer: Medicaid Other | Attending: Family Medicine | Admitting: Family Medicine

## 2018-03-23 ENCOUNTER — Encounter (HOSPITAL_COMMUNITY): Payer: Self-pay

## 2018-03-23 DIAGNOSIS — R1313 Dysphagia, pharyngeal phase: Secondary | ICD-10-CM

## 2018-03-23 DIAGNOSIS — M542 Cervicalgia: Secondary | ICD-10-CM

## 2018-03-23 MED ORDER — METHOCARBAMOL 500 MG PO TABS: 500.0000 mg | ORAL_TABLET | Freq: Two times a day (BID) | ORAL | 0 refills | Status: DC

## 2018-03-23 MED ORDER — PREDNISONE 20 MG PO TABS: 20.0000 mg | ORAL_TABLET | Freq: Two times a day (BID) | ORAL | 0 refills | Status: DC
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
## 2018-03-23 NOTE — Discharge Instructions (Addendum)
Take prednisone 2 times a day as directed Take the muscle relaxer at bedtime.  May take twice a day if needed.  Do not take this medicine and work as it may cause drowsiness Take ibuprofen 3 times a day with food as needed for pain Expect improvement over the next several days See your physician if not improved by next week

## 2019-05-16 ENCOUNTER — Ambulatory Visit: Payer: Self-pay | Admitting: Orthopedic Surgery

## 2019-05-16 ENCOUNTER — Ambulatory Visit (INDEPENDENT_AMBULATORY_CARE_PROVIDER_SITE_OTHER): Payer: Self-pay

## 2019-05-16 ENCOUNTER — Ambulatory Visit: Payer: Self-pay

## 2019-05-16 ENCOUNTER — Encounter: Payer: Self-pay | Admitting: Orthopedic Surgery

## 2019-05-16 ENCOUNTER — Other Ambulatory Visit: Payer: Self-pay

## 2019-05-16 DIAGNOSIS — M1A0790 Idiopathic chronic gout, unspecified ankle and foot, without tophus (tophi): Secondary | ICD-10-CM

## 2019-05-16 DIAGNOSIS — M79671 Pain in right foot: Secondary | ICD-10-CM

## 2019-05-16 DIAGNOSIS — M79672 Pain in left foot: Secondary | ICD-10-CM

## 2019-05-16 DIAGNOSIS — I872 Venous insufficiency (chronic) (peripheral): Secondary | ICD-10-CM

## 2019-05-16 MED ORDER — COLCHICINE 0.6 MG PO CAPS: 0.6000 mg | ORAL_CAPSULE | Freq: Two times a day (BID) | ORAL | 3 refills | Status: DC | PRN

## 2019-05-16 MED ORDER — ALLOPURINOL 100 MG PO TABS: 100.0000 mg | ORAL_TABLET | Freq: Two times a day (BID) | ORAL | 3 refills | Status: DC

## 2019-05-16 NOTE — Progress Notes (Signed)
Office Visit Note   Patient: Derek Lee           Date of Birth: [birth year]           MRN: [id number] Visit Date: 05/16/2019              Requested by: No referring provider defined for this encounter. PCP: System, Provider Not In  Chief Complaint  Patient presents with  . Left Foot - Pain  . Right Foot - Pain      HPI: Patient is a 43 year old gentleman who was seen for initial evaluation for pain and swelling in both feet he also states that the pain and swelling in both feet has also started in the right elbow over the olecranon bursa.  Patient states this all started about 3 weeks ago.  He states he has a history of gout he states he has been a gout study before but is currently not taking any medication he states he has taken colchicine in the past has tried ibuprofen without relief.  He states he has pain with weightbearing and is currently in a wheelchair with his leg dependent with venous stasis swelling in both lower extremities.  Assessment & Plan: Visit Diagnoses:  1. Pain in right foot   2. Pain in left foot   3. Idiopathic chronic gout of foot without tophus, unspecified laterality   4. Venous insufficiency of both lower extremities     Plan: Discussed the importance of using the calf muscle to pump the swelling up his leg the importance of elevation in the importance of compression socks.  Patient was given a prescription for extra-large compression socks.  We will draw a uric acid level today and a prescription for colchicine and allopurinol was called and we will see him back in 2 weeks.  Follow-Up Instructions: Return in about 2 weeks (around 05/30/2019).   Ortho Exam  Patient is alert, oriented, no adenopathy, well-dressed, normal affect, normal respiratory effort. Examination patient has a palpable pulse he has venous stasis swelling in both feet and legs no ulcers no brawny skin color changes.  There is no cellulitis no tenderness to palpation he does  have a palpable dorsalis pedis pulse his feet are generally tender to palpation.  He states he was been having some symptoms in the tibial tubercle the right knee but there is no redness or cellulitis or tenderness at this time he has had problems in the right olecranon bursa but this is asymptomatic today.  On examination patient's left calf is 41 cm in circumference right calf is 42 cm in circumference.  Imaging: XR Foot 2 Views Left  Result Date: 05/16/2019 2 view radiographs of the left foot shows no bony abnormalities no Charcot arthropathy no destructive bony changes.  XR Foot 2 Views Right  Result Date: 05/16/2019 2 view radiographs of the right foot shows no bony abnormalities no destructive bony changes no signs of Charcot arthropathy.  No images are attached to the encounter.  Labs: No results found for: HGBA1C, ESRSEDRATE, CRP, LABURIC, REPTSTATUS, GRAMSTAIN, CULT, LABORGA   No results found for: ALBUMIN, PREALBUMIN, LABURIC  No results found for: MG No results found for: VD25OH  No results found for: PREALBUMIN CBC EXTENDED Latest Ref Rng & Units 05/05/2015  WBC 4.0 - 10.5 K/uL 8.5  RBC 4.22 - 5.81 MIL/uL 4.64  HGB 13.0 - 17.0 g/dL 14.3  HCT 39.0 - 52.0 % 43.3  PLT 150 - 400 K/uL 239  There is no height or weight on file to calculate BMI.  Orders:  Orders Placed This Encounter  Procedures  . XR Foot 2 Views Right  . XR Foot 2 Views Left   No orders of the defined types were placed in this encounter.    Procedures: No procedures performed  Clinical Data: No additional findings.  ROS:  All other systems negative, except as noted in the HPI. Review of Systems  Objective: Vital Signs: There were no vitals taken for this visit.  Specialty Comments:  No specialty comments available.  PMFS History: Patient Active Problem List   Diagnosis Date Noted  . Fracture of humeral shaft, right, closed 02/16/2017  . Hyperlipidemia    Past Medical  History:  Diagnosis Date  . Asthma   . Hyperlipidemia   . Sleep apnea     History reviewed. No pertinent family history.  Past Surgical History:  Procedure Laterality Date  . ORIF HUMERUS FRACTURE Right 02/16/2017   Procedure: OPEN REDUCTION INTERNAL FIXATION (ORIF) RIGHT HUMERAL SHAFT FRACTURE;  Surgeon: [name], MD;  Location: [facility];  Service: Orthopedics;  Laterality: Right;   Social History   Occupational History  . Not on file  Tobacco Use  . Smoking status: Former Smoker    Quit date: 05/04/2014    Years since quitting: 5.0  . Smokeless tobacco: Never Used  Substance and Sexual Activity  . Alcohol use: Yes    Comment: last drink 3-4 days ago  . Drug use: No  . Sexual activity: Not on file

## 2019-05-17 LAB — EXTRA LAV TOP TUBE

## 2019-05-17 LAB — URIC ACID: Uric Acid, Serum: 10.9 mg/dL — ABNORMAL HIGH (ref 4.0–8.0)

## 2019-05-23 ENCOUNTER — Telehealth: Payer: Self-pay | Admitting: Orthopedic Surgery

## 2019-05-23 NOTE — Telephone Encounter (Signed)
Patient was called and given his test results and understood to continue the gout medications prescribed and on next visit in our office we will do another test to check if medications are working.  Patient's uric acid test was 10.9 and Dr Sharol Given would like it to be at least a 6. Patient understood these orders per Dr Sharol Given.

## 2019-05-23 NOTE — Telephone Encounter (Signed)
Patient called in requesting a call back from Dr. Sharol Given nurse. Patient wanted to now blood work results.  Patient phone number is [phone].

## 2019-05-30 ENCOUNTER — Ambulatory Visit: Payer: Medicaid Other | Admitting: Orthopedic Surgery

## 2019-07-06 ENCOUNTER — Other Ambulatory Visit: Payer: Self-pay

## 2019-09-21 ENCOUNTER — Ambulatory Visit: Payer: Medicaid Other | Admitting: Podiatry

## 2019-09-21 ENCOUNTER — Other Ambulatory Visit: Payer: Self-pay | Admitting: Podiatry

## 2019-09-21 ENCOUNTER — Encounter: Payer: Self-pay | Admitting: Podiatry

## 2019-09-21 ENCOUNTER — Other Ambulatory Visit: Payer: Self-pay

## 2019-09-21 ENCOUNTER — Ambulatory Visit (INDEPENDENT_AMBULATORY_CARE_PROVIDER_SITE_OTHER): Payer: Medicaid Other

## 2019-09-21 VITALS — Temp 97.6°F

## 2019-09-21 DIAGNOSIS — M216X1 Other acquired deformities of right foot: Secondary | ICD-10-CM

## 2019-09-21 DIAGNOSIS — M79671 Pain in right foot: Secondary | ICD-10-CM

## 2019-09-21 DIAGNOSIS — M79672 Pain in left foot: Secondary | ICD-10-CM

## 2019-09-21 DIAGNOSIS — M19072 Primary osteoarthritis, left ankle and foot: Secondary | ICD-10-CM

## 2019-09-21 DIAGNOSIS — M19071 Primary osteoarthritis, right ankle and foot: Secondary | ICD-10-CM

## 2019-09-21 DIAGNOSIS — R251 Tremor, unspecified: Secondary | ICD-10-CM

## 2019-09-21 DIAGNOSIS — M216X2 Other acquired deformities of left foot: Secondary | ICD-10-CM

## 2019-09-22 ENCOUNTER — Telehealth: Payer: Self-pay | Admitting: Podiatry

## 2019-09-22 DIAGNOSIS — R251 Tremor, unspecified: Secondary | ICD-10-CM

## 2019-09-22 NOTE — Telephone Encounter (Signed)
Pt called wanting to know which neurologist Dr. Allena Katz is sending him to so he can call to schedule.

## 2019-09-23 ENCOUNTER — Encounter: Payer: Self-pay | Admitting: Neurology

## 2019-09-23 NOTE — Telephone Encounter (Signed)
Hi Valery,  The clinicals are in now.  He can send him to any local neurologist if there is [facility] neurology.  Thank you

## 2019-09-23 NOTE — Progress Notes (Signed)
Subjective:  Patient ID: Derek Lee, male    DOB: [birth year],  MRN: [id number]  Chief Complaint  Patient presents with  . Foot Pain    BL "history of Gout and Arthritis; last flare-up was about a month ago; but lately feet have been swelling and very painful (dull aching/sorness); Left foot shakes sometimes uncontrollably; been going on for about 3wks"    44 y.o. male presents with the above complaint.  Patient presents with bilateral generalized foot pain and weakness with with localized pain to the dorsal midfoot bilaterally.  Patient states that it is mostly swollen there is pain associated with it.  Patient has shuffling gait that is making him hard to walk.  Patient does not have any strength for postop as well as heel lift.  Patient states that this has been going on for a long period of time.  The pain has been going on for past 3 months.  Patient states that there he also has gets tremors especially on the left side of his lower extremity that is unable to control.  He states that sometimes he is not able to walk for quite some time.  He denies any other acute complaints.  He would like to do further work-up into the generalized lower extremity as well as associated his tremor.  He has not seen anyone else prior to seeing me.   Review of Systems: Negative except as noted in the HPI. Denies N/V/F/Ch.  Past Medical History:  Diagnosis Date  . Asthma   . Hyperlipidemia   . Sleep apnea     Current Outpatient Medications:  .  allopurinol (ZYLOPRIM) 100 MG tablet, Take 1 tablet (100 mg total) by mouth 2 (two) times daily., Disp: 60 tablet, Rfl: 3 .  Colchicine 0.6 MG CAPS, Take 0.6 mg by mouth 2 (two) times daily as needed., Disp: 60 capsule, Rfl: 3 .  colchicine 0.6 MG tablet, Take 2 tablets followed by 1 tablet an hour later as needed for gout flare.  May repeat in 3 days if symptoms persist., Disp: 12 tablet, Rfl: 0 .  ibuprofen (ADVIL,MOTRIN) 800 MG tablet, Take 1 tablet (800  mg total) by mouth every 8 (eight) hours as needed (for knee pain)., Disp: 21 tablet, Rfl: 0 .  methocarbamol (ROBAXIN) 500 MG tablet, Take 1 tablet (500 mg total) by mouth 2 (two) times daily., Disp: 20 tablet, Rfl: 0 .  predniSONE (DELTASONE) 20 MG tablet, Take 1 tablet (20 mg total) by mouth 2 (two) times daily with a meal., Disp: 10 tablet, Rfl: 0  Social History   Tobacco Use  Smoking Status Former Smoker  . Quit date: 05/04/2014  . Years since quitting: 5.3  Smokeless Tobacco Never Used    No Known Allergies Objective:   Vitals:   09/21/19 0945  Temp: 97.6 F (36.4 C)   There is no height or weight on file to calculate BMI. Constitutional Well developed. Well nourished.  Vascular Dorsalis pedis pulses palpable bilaterally. Posterior tibial pulses palpable bilaterally. Capillary refill normal to all digits.  No cyanosis or clubbing noted. Pedal hair growth normal.  Neurologic Normal speech. Oriented to person, place, and time. Tremor noted to the left lower extremity sporadic in nature.  Dermatologic Nails well groomed and normal in appearance. No open wounds. No skin lesions.  Orthopedic:  Pain on palpation to the dorsal midfoot bilaterally.  No pain with range of motion of all the digits.  No pain with extensor and flexor range of  motion active and passive and resisted.  Manual muscle testing shows 3 out of 5 strength to the left side and 4 out of 5 to the right side.   Radiographs: 3 views of skeletally mature adult bilateral foot: Severe arthritis noted to the midfoot as well as the STT joint and ankle joint.  Severe arthritis noted to the first metatarsophalangeal joint as well.  Left side is slightly worse than the right side.  Hammertoe contractures of digits 2 through 5 noted.  Posterior heel spurring noted.  First ray elevatus noted. Assessment:   1. Foot pain, bilateral   2. Tremor of unknown origin   3. Arthritis of ankle or foot, degenerative, right   4.  Arthritis of ankle or foot, degenerative, left    Plan:  Patient was evaluated and treated and all questions answered.  Generalized weakness to the bilateral lower extremity with left greater than right with associated tremor of unknown etiology -I explained to the patient the various etiologies that could be associated with tremor and generalized weakness to the lower extremity.  I believe that this could likely be attributed to underlying neurological deficit of unknown origin.  I believe patient will benefit from a neurology follow-up and work-up to rule out Charcot-Marie-Tooth and various other lower and upper motor lesions.  Patient is experiencing shuffle gait with weakness in the toe off as well as heel raise and is unable to undergo normal gait patterns.  This could likely be attributed to underlying neurological deficit or may purely be musculoskeletal in nature. -I will await neurological follow-up and then we will discuss bracing based on the underlying cause if any.  Patient would definitely need bracing to allow for heel lift with toe off during gait. -He will be scheduled to be followed up with a neurologist.   Bilateral dorsal midfoot arthritis -I explained the patient the etiology of arthritis and various treatment options were extensively discussed I believe patient will benefit from a steroid injection to help decrease inflammatory component associated with pain.  Patient agrees with the plan would like to proceed with injection to bilateral dorsal midfoot point of maximal tenderness. -A steroid injection was performed at bilateral dorsal midfoot using 1% plain Lidocaine and 10 mg of Kenalog. This was well tolerated.   Return in about 4 weeks (around 10/19/2019), or See Dr. Allena Katz, for See Raiford Noble for bracing.

## 2019-09-23 NOTE — Telephone Encounter (Signed)
Faxed referral, clinicals and demographics to [facility]. 

## 2019-09-23 NOTE — Addendum Note (Signed)
Addended by: Alphia Kava D on: 09/23/2019 08:42 AM   Modules accepted: Orders

## 2019-09-28 ENCOUNTER — Ambulatory Visit: Payer: Self-pay | Admitting: Podiatry

## 2019-10-07 NOTE — Progress Notes (Signed)
Assessment/Plan:   1.  Ataxia, ankle clonus, hyperreflexia, upgoing toe  -Patient needs MRI of the brain and cervical spine.  Patient expresses concern that he is limited by finances, as he is self-pay.  Would like to take this 1 step at a time.  I really would like to get this contrasted, but given finances, we will start with MRI of the brain without gadolinium and work from there.  If that is negative, we will go ahead and do an MRI of the cervical spine.  -I would like to get some basic lab work done as well, but again the patient is somewhat limited by finances.  He has no primary care physician.  In the end, I gave him information for [facility] patient assistance form and we decided to hold on lab work until he is able to fill that out.  I do think he needs basic chemistries, glucose, TSH (has a some exophthalmos), B12 (although he is on B vitamins)  2.  Follow-up with me will depend on the results of the above.  Subjective:   Derek Lee was seen today in the movement disorders clinic for neurologic consultation at the request of Felipa Furnace, DPM.  The consultation is for the evaluation of shuffling gait and L leg tremor.  This patient is accompanied in the office by his spouse who supplements the history.  Pt states that sx's started in Jan with gout and his feet hurt so bad he couldn't walk.  When he started to be able to walk, he noted balance change and went to [facility].  He told them he had tremor in the L leg.  He also noted that upon awakening that he has L arm/hand paresthesias.  He does state that he sleeps on the L side.   He states that the issue with not being able to walk well is PAIN in the foot.  He was sent here to make sure that he doesn't have a neurologic problem on top of the podiatric problem before surgery is initiated for the foot.  Tremor: Yes.     How long has it been going on? Jan  At rest or with activation?  Rest with foot laying in bed if foot hanging  down in the bed and pressure is placed on the foot  Fam hx of tremor?  No.  Located where?  L foot   Tremor inducing meds:  No.   Other Specific Symptoms:  Voice: no change Sleep: sleeps well Postural symptoms:  Yes.  , attributes to pain in the foot; doesn't have good balance  Falls?  No.,  Loss of smell:  No. Loss of taste:  No. Urinary Incontinence:  No. Difficulty Swallowing:  No. Trouble with ADL's:  No.  Trouble buttoning clothing: No. N/V:  No. Lightheaded:  No.  Syncope: No. Diplopia:  No. Dyskinesia:  No.  Paresthesias in the both feet and L hand upon awakening; no peri-anal paresthesias.   Had back pain a month ago but that has resolved.  Neuroimaging of the brain has not previously been performed.      ALLERGIES:  No Known Allergies  CURRENT MEDICATIONS:  Current Outpatient Medications  Medication Instructions  . allopurinol (ZYLOPRIM) 100 mg, Oral, 2 times daily  . Colchicine 0.6 mg, Oral, 2 times daily PRN  . ibuprofen (ADVIL) 800 mg, Oral, Every 8 hours PRN    Objective:   PHYSICAL EXAMINATION:    VITALS:   Vitals:  10/12/19 1006  BP: 103/69  Pulse: 78  SpO2: 97%  Weight: 220 lb (99.8 kg)  Height: 5\' 7"  (1.702 m)    GEN:  The patient appears stated age and is in NAD. HEENT:  Normocephalic, atraumatic.  He has some exophthalmos.  The mucous membranes are moist. The superficial temporal arteries are without ropiness or tenderness. CV:  RRR Lungs:  CTAB Neck/HEME:  There are no carotid bruits bilaterally.  Neurological examination:  Orientation: The patient is alert and oriented x3.  Cranial nerves: There is good facial symmetry.  Extraocular muscles are intact. The visual fields are full to confrontational testing. The speech is fluent and clear. Soft palate rises symmetrically and there is no tongue deviation. Hearing is intact to conversational tone. Sensation: Sensation is intact to light touch throughout (facial, trunk, extremities).  Vibration is intact at the bilateral big toe. There is no extinction with double simultaneous stimulation.  Motor: Strength is 5/5 in the bilateral upper and lower extremities.   Shoulder shrug is equal and symmetric.  There is no pronator drift. Deep tendon reflexes: Deep tendon reflexes are 3/4 at the bilateral biceps, triceps, brachioradialis, patella and achilles.  There are crossed adductor reflexes bilaterally.  Plantar response is upgoing on the right and neutral on the left.  Movement examination: Tone: There is normal in the upper and lower extremities. Abnormal movements: There is no tremor.  There is occasional ankle clonus bilaterally, nonsustained.  It is difficult to get the ankle clonus out because the patient has pain in the ankles when it is manipulated Coordination:  There is no decremation with RAM's Gait and Station: The patient is in a wheelchair.  He pushes off of the wheelchair to arise.  He is given a walker.  He walks fairly well, although he is wide-based with the walker.  When the walker is taken away, he remains wide-based, somewhat unsteady and ataxic. I have reviewed and interpreted the following labs independently   Chemistry      Component Value Date/Time   NA 139 05/05/2015 1730   K 4.1 05/05/2015 1730   CL 104 05/05/2015 1730   CO2 25 05/05/2015 1730   BUN 13 05/05/2015 1730   CREATININE 1.05 05/05/2015 1730      Component Value Date/Time   CALCIUM 9.1 05/05/2015 1730      No results found for: TSH Lab Results  Component Value Date   WBC 8.5 05/05/2015   HGB 14.3 05/05/2015   HCT 43.3 05/05/2015   MCV 93.3 05/05/2015   PLT 239 05/05/2015      Total time spent on today's visit was  60 minutes, including both face-to-face time and nonface-to-face time.  Time included that spent on review of records (prior notes available to me/labs/imaging if pertinent), discussing treatment and goals, answering patient's questions and coordinating care.  Cc:   Patient, No Pcp Per

## 2019-10-12 ENCOUNTER — Ambulatory Visit (INDEPENDENT_AMBULATORY_CARE_PROVIDER_SITE_OTHER): Payer: Self-pay | Admitting: Neurology

## 2019-10-12 ENCOUNTER — Other Ambulatory Visit: Payer: Self-pay

## 2019-10-12 ENCOUNTER — Encounter: Payer: Self-pay | Admitting: Neurology

## 2019-10-12 VITALS — BP 103/69 | HR 78 | Ht 67.0 in | Wt 220.0 lb

## 2019-10-12 DIAGNOSIS — R292 Abnormal reflex: Secondary | ICD-10-CM

## 2019-10-12 DIAGNOSIS — R258 Other abnormal involuntary movements: Secondary | ICD-10-CM

## 2019-10-12 DIAGNOSIS — R270 Ataxia, unspecified: Secondary | ICD-10-CM

## 2019-10-12 NOTE — Patient Instructions (Addendum)
After Visit Summary:   Medication Changes: Your physician recommends that you continue on your current medications as directed. Please refer to the Current Medication list given to you today.  Lab work: None ordered today   Testing: Dr Tat is ordering a MRI of your Brain with out Contrast.  Please call [facility] to schedule your test.  [phone]  Referral:  None ordered today   Follow up:  Your physician recommends that you schedule a follow-up appointment in: To be determined

## 2019-10-25 ENCOUNTER — Ambulatory Visit: Payer: Medicaid Other | Admitting: Neurology

## 2019-10-31 ENCOUNTER — Other Ambulatory Visit: Payer: Self-pay

## 2019-10-31 ENCOUNTER — Telehealth: Payer: Self-pay | Admitting: Neurology

## 2019-10-31 DIAGNOSIS — R270 Ataxia, unspecified: Secondary | ICD-10-CM

## 2019-10-31 DIAGNOSIS — R292 Abnormal reflex: Secondary | ICD-10-CM

## 2019-10-31 NOTE — Telephone Encounter (Signed)
Okay per my last note.  I wanted both of them but we had decided to do one at time due to financial concerns

## 2019-10-31 NOTE — Telephone Encounter (Signed)
Patient called and requested to have his MRI of spine or neck as well as the one for the head all at once. He said he now has new insurance and wants to get all the testing done at once, if possible.

## 2019-10-31 NOTE — Telephone Encounter (Signed)
Ordered mri

## 2019-10-31 NOTE — Telephone Encounter (Signed)
Advise please.

## 2019-11-08 ENCOUNTER — Other Ambulatory Visit: Payer: Medicaid Other | Admitting: Orthotics

## 2019-11-17 ENCOUNTER — Ambulatory Visit: Payer: 59 | Admitting: Orthotics

## 2019-11-17 ENCOUNTER — Other Ambulatory Visit: Payer: Self-pay

## 2019-11-17 DIAGNOSIS — M79672 Pain in left foot: Secondary | ICD-10-CM

## 2019-11-17 DIAGNOSIS — R251 Tremor, unspecified: Secondary | ICD-10-CM

## 2019-11-17 DIAGNOSIS — M19071 Primary osteoarthritis, right ankle and foot: Secondary | ICD-10-CM

## 2019-11-17 NOTE — Progress Notes (Signed)
Patient needs to have MRI evaluation prior to being cast for brace(s) as we aren't quite sure of etiology of essential tremors.

## 2019-11-23 ENCOUNTER — Ambulatory Visit (INDEPENDENT_AMBULATORY_CARE_PROVIDER_SITE_OTHER): Payer: 59 | Admitting: Podiatry

## 2019-11-23 ENCOUNTER — Other Ambulatory Visit: Payer: Self-pay

## 2019-11-23 DIAGNOSIS — M19079 Primary osteoarthritis, unspecified ankle and foot: Secondary | ICD-10-CM | POA: Diagnosis not present

## 2019-11-23 DIAGNOSIS — M778 Other enthesopathies, not elsewhere classified: Secondary | ICD-10-CM | POA: Diagnosis not present

## 2019-11-25 ENCOUNTER — Encounter: Payer: Self-pay | Admitting: Podiatry

## 2019-11-25 NOTE — Progress Notes (Signed)
Subjective:  Patient ID: Derek Lee, male    DOB: 12/21/1975,  MRN: [id number]  Chief Complaint  Patient presents with  . Foot Pain    pt is here for bil foot pain f/u, pt states that he is concerned that his foot is still painful, pt also states that the foot pain is elevated to the touch. Pt is concerned that his previous injection has not helped as well.    44 y.o. male presents with the above complaint.  Patient presents with a complaint of right lateral foot pain around the fourth metatarsal and left dorsal midfoot pain.  Patient states been going for quite some time.  Patient states she had he had received injection in the past which has helped a lot.  Patient would like to do another injection.  The patient the pain has moved a little bit.  He states that the injection has helped in the past.  Pain scale 7 out of 10.  The pain is dull achy in nature.  He denies any other acute complaints.   Review of Systems: Negative except as noted in the HPI. Denies N/V/F/Ch.  Past Medical History:  Diagnosis Date  . Asthma   . Gout   . Hyperlipidemia   . Sleep apnea    noncompliant with cpap    Current Outpatient Medications:  .  allopurinol (ZYLOPRIM) 100 MG tablet, Take 1 tablet (100 mg total) by mouth 2 (two) times daily., Disp: 60 tablet, Rfl: 3 .  Colchicine 0.6 MG CAPS, Take 0.6 mg by mouth 2 (two) times daily as needed., Disp: 60 capsule, Rfl: 3 .  ibuprofen (ADVIL,MOTRIN) 800 MG tablet, Take 1 tablet (800 mg total) by mouth every 8 (eight) hours as needed (for knee pain)., Disp: 21 tablet, Rfl: 0  Social History   Tobacco Use  Smoking Status Former Smoker  . Quit date: 05/04/2014  . Years since quitting: 5.5  Smokeless Tobacco Never Used    No Known Allergies Objective:  There were no vitals filed for this visit. There is no height or weight on file to calculate BMI. Constitutional Well developed. Well nourished.  Vascular Dorsalis pedis pulses palpable  bilaterally. Posterior tibial pulses palpable bilaterally. Capillary refill normal to all digits.  No cyanosis or clubbing noted. Pedal hair growth normal.  Neurologic Normal speech. Oriented to person, place, and time. Epicritic sensation to light touch grossly present bilaterally.  Dermatologic Nails well groomed and normal in appearance. No open wounds. No skin lesions.  Orthopedic:  Pain on palpation localized to the right lateral foot around the midshaft of the fourth metatarsal and left dorsal midfoot.  Pain on palpation to the both of the sites.  No pain with extensor and flexor range of motion.  No pain with resisted dorsiflexion and plantarflexion of the digits.   Radiographs: None Assessment:   1. Capsulitis of right foot   2. Capsulitis of left foot   3. Arthritis of midfoot    Plan:  Patient was evaluated and treated and all questions answered.  Right lateral midfoot arthritis as well as left dorsal midfoot arthritis each with underlying capsulitis - The patient the etiology of arthritis and various treatment options were discussed.  I believe patient will benefit from a steroid injection to help decrease the acute inflammatory component with pain.  Patient agrees with the plan would like to proceed with a steroid injection to both of those spots. -A steroid injection was performed at right lateral foot at the  point of maximal tenderness using 1% plain Lidocaine and 10 mg of Kenalog. This was well tolerated. -A steroid injection was performed at left dorsal midfoot using 1% plain Lidocaine and 10 mg of Kenalog. This was well tolerated.   No follow-ups on file.

## 2019-12-01 ENCOUNTER — Ambulatory Visit
Admission: RE | Admit: 2019-12-01 | Discharge: 2019-12-01 | Disposition: A | Discharge status: Home / Self Care | Payer: 59 | Source: Ambulatory Visit | Attending: Neurology | Admitting: Neurology

## 2019-12-01 ENCOUNTER — Telehealth: Payer: Self-pay | Admitting: Neurology

## 2019-12-01 DIAGNOSIS — R270 Ataxia, unspecified: Secondary | ICD-10-CM

## 2019-12-01 DIAGNOSIS — R292 Abnormal reflex: Secondary | ICD-10-CM

## 2019-12-01 MED ORDER — GADOBENATE DIMEGLUMINE 529 MG/ML IV SOLN
20.0000 mL | Freq: Once | INTRAVENOUS | Status: AC | PRN
  Administered 2019-12-01: 20 mL via INTRAVENOUS

## 2019-12-01 NOTE — Telephone Encounter (Signed)
LMOM for patient that his MRI Brain has been authorized- valid until 02/20/20. He is okay to schedule. Wasn't sure if he wanted to schedule with [facility] or [facility] but I left info for both places as they can see referral in the system.

## 2019-12-01 NOTE — Telephone Encounter (Signed)
Patient called and said he had an MRI of the spine today but could not have the MRI of the brain done as the test was not authorized yet.   Patient stated he will need to reschedule the MRI of the brain once it is authorized with his insurance.

## 2019-12-02 ENCOUNTER — Telehealth: Payer: Self-pay | Admitting: Neurology

## 2019-12-02 NOTE — Telephone Encounter (Signed)
Let pt know that MRI cervical spine looked okay.  Is there a reason that the brain MRI we ordered wasn't done (call radiology and ask).  I do see note from yesterday in chart that MRI brain was authorized so maybe waiting on scheduling?  This MRI was ordered in May so unsure why the hold up?

## 2019-12-02 NOTE — Telephone Encounter (Signed)
Spoke with pt and informed of test results per Dr Tat -  MRI cervical spine looked okay.  He is on the phone now getting MRI of the brain scheduled with [facility].

## 2019-12-09 ENCOUNTER — Ambulatory Visit
Admission: RE | Admit: 2019-12-09 | Discharge: 2019-12-09 | Disposition: A | Discharge status: Home / Self Care | Payer: 59 | Source: Ambulatory Visit | Attending: Neurology | Admitting: Neurology

## 2019-12-09 ENCOUNTER — Other Ambulatory Visit: Payer: Self-pay

## 2019-12-13 ENCOUNTER — Telehealth: Payer: Self-pay | Admitting: Neurology

## 2019-12-13 NOTE — Telephone Encounter (Signed)
Patient called in after getting his MRI results on mychart. I scheduled him for 12/28/19 at 8:45 like Dr. Arbutus Leas requested. He still said he wants to talk to someone before then.

## 2019-12-13 NOTE — Telephone Encounter (Signed)
Please bring in patient 8/4 at 8:45 to go over MRI brain and decide next steps.  Use NP slot, the entire slot

## 2019-12-13 NOTE — Telephone Encounter (Signed)
Pt is sch for 12-28-19

## 2019-12-13 NOTE — Telephone Encounter (Signed)
I'm on vacation until then, which is why I made the appt right when he came back.  He may need further testing but given lack of insurance I wanted to chat with him first to make sure its feasible for him.  I think its best to talk in person and I will see him right when I am returning.

## 2019-12-13 NOTE — Telephone Encounter (Signed)
Explained to patient that Dr Tat is out of the office and all of his concerns and questions will be addressed at his appointment. He states he does have insurance and wanted to know if another provider could review his mri and discuss the results with him. He states he saw another doctor in this office and who reviewed a spine MRI with him. He states he has reviewed his mri and saw something about white matter and has a lot of questions. He states he did a little research on the Internet but rather have someone go over everything with him. Advised him again that all his questions and concerns will be addressed at his follow up appt.   He voiced understanding.

## 2019-12-23 ENCOUNTER — Ambulatory Visit: Payer: 59 | Admitting: Podiatry

## 2019-12-26 NOTE — Progress Notes (Signed)
Assessment/Plan:   1. Abnormal brain scan  -Concern is for demyelinating disease.  -Would recommend MRI of the brain with gadolinium (recently done without).  -Patient shown his MRI films today and we reviewed those in detail.  Patient asked questions and I answered those to the best of my ability today.  -Would recommend lumbar puncture (white blood cells, red blood cells, protein, glucose, oligoclonal bands, cytology, myelin basic protein, Gram stain with culture and sensitivity).  R/b/se discussed in detail  2.  Foot pain  -discussed that foot pain is independent of abnormal MRI.   -following with podiatry  3.  Follow-up after the above has been completed. Subjective:   Derek Lee was seen today in follow up for difficulty ambulating, and results of his testing. He had an MRI of the cervical spine on July 8 and MRI of the brain on July 16. MRI of the cervical spine demonstrated no cord signal abnormality. MRI of the brain was personally reviewed. This was done without gadolinium. There were several scattered T2 hyperintensities throughout. My previous records as well as any outside records available were reviewed prior to todays visit.  Patient states that he is doing about the same.  He admits that he is just very scared to walk now, given that he does not walk much and has not walked for a long time.  He states that some of that, he admits, is psychologic.  He denies diplopia or history of such.  States that bladder and bowel are under good control.  Denies any paresthesias or history of such.  Is having foot pain, and states that some of his ambulation issues are due to that.  Some are just due to loss of balance.   CURRENT MEDICATIONS:  Outpatient Encounter Medications as of 12/28/2019  Medication Sig  . allopurinol (ZYLOPRIM) 100 MG tablet Take 1 tablet (100 mg total) by mouth 2 (two) times daily.  . Colchicine 0.6 MG CAPS Take 0.6 mg by mouth 2 (two) times daily as needed.  [name]  ibuprofen (ADVIL,MOTRIN) 800 MG tablet Take 1 tablet (800 mg total) by mouth every 8 (eight) hours as needed (for knee pain).   No facility-administered encounter medications on file as of 12/28/2019.     Objective:   PHYSICAL EXAMINATION:    VITALS:   Vitals:   12/28/19 0842  BP: 124/77  Pulse: 88  SpO2: 94%  Height: 5\' 7"  (1.702 m)    GEN:  The patient appears stated age and is in NAD. HEENT:  Normocephalic, atraumatic.  The mucous membranes are moist. The superficial temporal arteries are without ropiness or tenderness. CV:  RRR Lungs:  CTAB Neck/HEME:  There are no carotid bruits bilaterally.  Neurological examination:  Orientation: The patient is alert and oriented x3. Cranial nerves: There is good facial symmetry.The speech is fluent and clear.  The extraocular muscles are intact.  Soft palate rises symmetrically and there is no tongue deviation. Hearing is intact to conversational tone. Sensation: Sensation is intact to light touch throughout Motor: Strength is at least antigravity x4. Deep tendon reflexes: Deep tendon reflexes are 2+ at the bilateral biceps, triceps, brachioradialis and patella.  Movement examination: Tone: There is normal tone in the UE/LE Abnormal movements:  no tremor.  No myoclonus.  No asterixis.   Coordination:  There is no decremation with RAM's. Gait and Station: The patient pushes off of the wheelchair to arise.  He is given a walker.  He is very tenuous, but  walks fairly well with the walker.  He does not bend his knees much with ambulation.    Total time spent on today's visit was 45 minutes, including both face-to-face time and nonface-to-face time.  Time included that spent on review of records (prior notes available to me/labs/imaging if pertinent), discussing treatment and goals, answering patient's questions and coordinating care.  Cc:  Patient, No Pcp Per

## 2019-12-28 ENCOUNTER — Encounter: Payer: Self-pay | Admitting: Neurology

## 2019-12-28 ENCOUNTER — Ambulatory Visit (INDEPENDENT_AMBULATORY_CARE_PROVIDER_SITE_OTHER): Payer: 59 | Admitting: Neurology

## 2019-12-28 ENCOUNTER — Other Ambulatory Visit: Payer: Self-pay

## 2019-12-28 VITALS — BP 124/77 | HR 88 | Ht 67.0 in

## 2019-12-28 DIAGNOSIS — R9402 Abnormal brain scan: Secondary | ICD-10-CM

## 2019-12-28 DIAGNOSIS — M79672 Pain in left foot: Secondary | ICD-10-CM

## 2019-12-28 DIAGNOSIS — G379 Demyelinating disease of central nervous system, unspecified: Secondary | ICD-10-CM

## 2019-12-28 NOTE — Patient Instructions (Signed)
A referral to [facility] has been placed for your MRI someone will contact you directly to schedule your appt. They are located at [address] [facility]. Please contact them directly by calling [phone] with any questions regarding your referral.  Do not take aspirin or aspirin containing products 5 days prior to your spinal tap.  Call me with the dates and times of your above appointments so we can get you a follow up appointment here.  The physicians and staff at [facility] are committed to providing excellent care. You may receive a survey requesting feedback about your experience at our office. We strive to receive "very good" responses to the survey questions. If you feel that your experience would prevent you from giving the office a "very good " response, please contact our office to try to remedy the situation. We may be reached at [phone]. Thank you for taking the time out of your busy day to complete the survey.

## 2019-12-29 ENCOUNTER — Telehealth: Payer: Self-pay | Admitting: Neurology

## 2019-12-29 NOTE — Telephone Encounter (Signed)
Waiting a month won't change insurance payment, although he can wait a month if he just prefers to do so

## 2019-12-29 NOTE — Telephone Encounter (Signed)
Left detailed message with Dr Tat's recommendations, ok per DPR, and to call back if any questions.  

## 2019-12-29 NOTE — Telephone Encounter (Signed)
Patient called in and wanted to let Dr. Arbutus Leas know he wanted to hold off on the MRI for about a month. He just got some done and he wants to make sure his insurance will pay for it.

## 2019-12-30 ENCOUNTER — Other Ambulatory Visit: Payer: 59

## 2020-01-11 ENCOUNTER — Encounter: Payer: Self-pay | Admitting: Neurology

## 2020-01-11 NOTE — Progress Notes (Signed)
Received fax from [employer] of denial for MRI of brain with contrast- wanting to know why we are ordering more imaging so soon after last MRI- Sent in response with clinicals/results on 01/11/20 to [facility] fax # [phone]. Awaiting determination.

## 2020-01-20 ENCOUNTER — Ambulatory Visit (INDEPENDENT_AMBULATORY_CARE_PROVIDER_SITE_OTHER): Payer: 59 | Admitting: Podiatry

## 2020-01-20 ENCOUNTER — Other Ambulatory Visit: Payer: Self-pay

## 2020-01-20 ENCOUNTER — Encounter: Payer: Self-pay | Admitting: Podiatry

## 2020-01-20 DIAGNOSIS — M05771 Rheumatoid arthritis with rheumatoid factor of right ankle and foot without organ or systems involvement: Secondary | ICD-10-CM

## 2020-01-20 DIAGNOSIS — M778 Other enthesopathies, not elsewhere classified: Secondary | ICD-10-CM

## 2020-01-20 DIAGNOSIS — R2689 Other abnormalities of gait and mobility: Secondary | ICD-10-CM

## 2020-01-20 NOTE — Progress Notes (Signed)
Subjective:  Patient ID: Derek Lee, male    DOB: 04-17-1976,  MRN: [id number]  Chief Complaint  Patient presents with  . Follow-up    Pt states after getting injections he feels able to walk for 1-2 weeks then the pain comes back- fu    44 y.o. male presents with the above complaint.  Patient presents with a follow-up of right lateral foot pain as well as left dorsal midfoot pain.  Patient states the pain is getting better the injection helps a lot.  He states that he may need little bit more injections.  Patient has a history of rheumatoid arthritis for which she has not seen anyone else or gotten worked up for it.  He denies any other acute complaints.  He needs more steroid injection.  He also would like to discuss physical therapy for generalized weakness to be able to ambulate.   Review of Systems: Negative except as noted in the HPI. Denies N/V/F/Ch.  Past Medical History:  Diagnosis Date  . Asthma   . Gout   . Hyperlipidemia   . Sleep apnea    noncompliant with cpap    Current Outpatient Medications:  .  allopurinol (ZYLOPRIM) 100 MG tablet, Take 1 tablet (100 mg total) by mouth 2 (two) times daily., Disp: 60 tablet, Rfl: 3 .  Colchicine 0.6 MG CAPS, Take 0.6 mg by mouth 2 (two) times daily as needed., Disp: 60 capsule, Rfl: 3 .  ibuprofen (ADVIL,MOTRIN) 800 MG tablet, Take 1 tablet (800 mg total) by mouth every 8 (eight) hours as needed (for knee pain)., Disp: 21 tablet, Rfl: 0  Social History   Tobacco Use  Smoking Status Former Smoker  . Quit date: 05/04/2014  . Years since quitting: 5.7  Smokeless Tobacco Never Used    No Known Allergies Objective:  There were no vitals filed for this visit. There is no height or weight on file to calculate BMI. Constitutional Well developed. Well nourished.  Vascular Dorsalis pedis pulses palpable bilaterally. Posterior tibial pulses palpable bilaterally. Capillary refill normal to all digits.  No cyanosis or  clubbing noted. Pedal hair growth normal.  Neurologic Normal speech. Oriented to person, place, and time. Epicritic sensation to light touch grossly present bilaterally.  Dermatologic Nails well groomed and normal in appearance. No open wounds. No skin lesions.  Orthopedic:  Pain on palpation localized to the right lateral foot around the midshaft of the fourth metatarsal and left dorsal midfoot.  Pain on palpation to the both of the sites.  No pain with extensor and flexor range of motion.  No pain with resisted dorsiflexion and plantarflexion of the digits.   Radiographs: None Assessment:   1. 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 Antalgic gait   3. Capsulitis of right foot   4. Capsulitis of left foot    Plan:  Patient was evaluated and treated and all questions answered.  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  I believe patient will benefit from a steroid injection to help decrease the acute inflammatory component with pain.  Patient agrees with the plan would like to proceed with a steroid injection to both of those spots. -A second steroid injection was performed at right lateral foot at the point of maximal tenderness using 1% plain Lidocaine and 10 mg of Kenalog. This was well tolerated. -  A second steroid injection was performed at left dorsal midfoot using 1% plain Lidocaine and 10 mg of Kenalog. This was well tolerated. -Rheumatoid panel was ordered and I encouraged patient to follow-up with primary care physician or rheumatologist for intervention.  Patient states understanding -He will also follow-up with physical therapy for generalized weakness to both lower extremity as well as gait training and strengthening.  No follow-ups on file.

## 2020-01-26 LAB — RHEUMATOID ARTHRITIS PROFILE
Cyclic Citrullin Peptide Ab: 3 units (ref 0–19)
Rheumatoid fact SerPl-aCnc: <10 IU/mL (ref 0.0–13.9)

## 2020-02-17 ENCOUNTER — Encounter: Payer: Self-pay | Admitting: Podiatry

## 2020-02-17 ENCOUNTER — Other Ambulatory Visit: Payer: Self-pay

## 2020-02-17 ENCOUNTER — Ambulatory Visit (INDEPENDENT_AMBULATORY_CARE_PROVIDER_SITE_OTHER): Payer: 59 | Admitting: Podiatry

## 2020-02-17 DIAGNOSIS — M778 Other enthesopathies, not elsewhere classified: Secondary | ICD-10-CM

## 2020-02-17 DIAGNOSIS — M76821 Posterior tibial tendinitis, right leg: Secondary | ICD-10-CM

## 2020-02-17 NOTE — Progress Notes (Signed)
  Subjective:  Patient ID: Derek Lee, male    DOB: December 30, 1975,  MRN: [id number]  Chief Complaint  Patient presents with  . Follow-up    Pt states he has improved- right still giving him some issues- left is doing well.    44 y.o. male presents with the above complaint.  Patient presents with a follow-up of bilateral dorsal midfoot arthritic pain.  Patient states is doing a lot better with the injection.  He does have a new onset of pain to the medial side of the foot on the right side.  His left side is doing much better.  He denies any other acute complaints.   Review of Systems: Negative except as noted in the HPI. Denies N/V/F/Ch.  Past Medical History:  Diagnosis Date  . Asthma   . Gout   . Hyperlipidemia   . Sleep apnea    noncompliant with cpap    Current Outpatient Medications:  .  allopurinol (ZYLOPRIM) 100 MG tablet, Take 1 tablet (100 mg total) by mouth 2 (two) times daily., Disp: 60 tablet, Rfl: 3 .  Colchicine 0.6 MG CAPS, Take 0.6 mg by mouth 2 (two) times daily as needed., Disp: 60 capsule, Rfl: 3 .  ibuprofen (ADVIL,MOTRIN) 800 MG tablet, Take 1 tablet (800 mg total) by mouth every 8 (eight) hours as needed (for knee pain)., Disp: 21 tablet, Rfl: 0  Social History   Tobacco Use  Smoking Status Former Smoker  . Quit date: 05/04/2014  . Years since quitting: 5.7  Smokeless Tobacco Never Used    No Known Allergies Objective:  There were no vitals filed for this visit. There is no height or weight on file to calculate BMI. Constitutional Well developed. Well nourished.  Vascular Dorsalis pedis pulses palpable bilaterally. Posterior tibial pulses palpable bilaterally. Capillary refill normal to all digits.  No cyanosis or clubbing noted. Pedal hair growth normal.  Neurologic Normal speech. Oriented to person, place, and time. Epicritic sensation to light touch grossly present bilaterally.  Dermatologic Nails well groomed and normal in  appearance. No open wounds. No skin lesions.  Orthopedic:  No pain on palpation localized to the right lateral foot around the midshaft of the fourth metatarsal and left dorsal midfoot.  No pain on palpation to the both of the sites.  No pain with extensor and flexor range of motion.  No pain with resisted dorsiflexion and plantarflexion of the digits.  Pain on palpation at the insertion of the posterior tibial tendon.  No pain along the course of the tendon.  Pain with dorsiflexion eversion of the foot resisted.  No pain with plantarflexion inversion of the foot.   Radiographs: None Assessment:   No diagnosis found. Plan:  Patient was evaluated and treated and all questions answered.  Right posterior tibial tendinitis -I explained to the patient the etiology of tendinitis and various treatment options were discussed.  Given the amount of pain that is having I believe will benefit from steroid injection help decrease the acute inflammatory component with pain.  Physical therapy will also help with this as well.  Right lateral midfoot arthritis as well as left dorsal midfoot arthritis each with underlying capsulitis -Clinically resolved on both sides of the midfoot.  Rheumatoid panel was reviewed with the patient which was negative. -He will continue with physical therapy for generalized weakness to both lower extremity as well as gait training and strengthening.  No follow-ups on file.

## 2020-02-29 ENCOUNTER — Encounter (HOSPITAL_COMMUNITY): Payer: Self-pay | Admitting: Emergency Medicine

## 2020-02-29 ENCOUNTER — Other Ambulatory Visit: Payer: Self-pay

## 2020-02-29 ENCOUNTER — Emergency Department (HOSPITAL_COMMUNITY): Payer: 59

## 2020-02-29 ENCOUNTER — Emergency Department (HOSPITAL_COMMUNITY)
Admission: EM | Admit: 2020-02-29 | Discharge: 2020-02-29 | Disposition: A | Discharge status: Home / Self Care | Payer: 59 | Attending: Emergency Medicine | Admitting: Emergency Medicine

## 2020-02-29 DIAGNOSIS — R002 Palpitations: Secondary | ICD-10-CM | POA: Diagnosis present

## 2020-02-29 DIAGNOSIS — Z20822 Contact with and (suspected) exposure to covid-19: Secondary | ICD-10-CM | POA: Diagnosis not present

## 2020-02-29 DIAGNOSIS — J45909 Unspecified asthma, uncomplicated: Secondary | ICD-10-CM | POA: Diagnosis not present

## 2020-02-29 DIAGNOSIS — R110 Nausea: Secondary | ICD-10-CM | POA: Diagnosis not present

## 2020-02-29 DIAGNOSIS — R61 Generalized hyperhidrosis: Secondary | ICD-10-CM | POA: Diagnosis not present

## 2020-02-29 DIAGNOSIS — Z87891 Personal history of nicotine dependence: Secondary | ICD-10-CM | POA: Diagnosis not present

## 2020-02-29 DIAGNOSIS — Y33XXXA Other specified events, undetermined intent, initial encounter: Secondary | ICD-10-CM | POA: Insufficient documentation

## 2020-02-29 LAB — COMPREHENSIVE METABOLIC PANEL
ALT: 23 U/L (ref 0–44)
AST: 20 U/L (ref 15–41)
Albumin: 4.7 g/dL (ref 3.5–5.0)
Alkaline Phosphatase: 61 U/L (ref 38–126)
Anion gap: 18 — ABNORMAL HIGH (ref 5–15)
BUN: 14 mg/dL (ref 6–20)
CO2: 20 mmol/L — ABNORMAL LOW (ref 22–32)
Calcium: 10.3 mg/dL (ref 8.9–10.3)
Chloride: 96 mmol/L — ABNORMAL LOW (ref 98–111)
Creatinine, Ser: 1.16 mg/dL (ref 0.61–1.24)
GFR calc non Af Amer: >60 mL/min (ref >60)
Glucose, Bld: 84 mg/dL (ref 70–99)
Potassium: 4.1 mmol/L (ref 3.5–5.1)
Sodium: 134 mmol/L — ABNORMAL LOW (ref 135–145)
Total Bilirubin: 1.6 mg/dL — ABNORMAL HIGH (ref 0.3–1.2)
Total Protein: 9.2 g/dL — ABNORMAL HIGH (ref 6.5–8.1)

## 2020-02-29 LAB — CBC WITH DIFFERENTIAL/PLATELET
Abs Immature Granulocytes: 0.04 10*3/uL (ref 0.00–0.07)
Basophils Absolute: 0 10*3/uL (ref 0.0–0.1)
Basophils Relative: 0 %
Eosinophils Absolute: 0 10*3/uL (ref 0.0–0.5)
Eosinophils Relative: 0 %
HCT: 48 % (ref 39.0–52.0)
Hemoglobin: 16.4 g/dL (ref 13.0–17.0)
Immature Granulocytes: 0 %
Lymphocytes Relative: 14 %
Lymphs Abs: 1.5 10*3/uL (ref 0.7–4.0)
MCH: 31.8 pg (ref 26.0–34.0)
MCHC: 34.2 g/dL (ref 30.0–36.0)
MCV: 93.2 fL (ref 80.0–100.0)
Monocytes Absolute: 1 10*3/uL (ref 0.1–1.0)
Monocytes Relative: 10 %
Neutro Abs: 7.8 10*3/uL — ABNORMAL HIGH (ref 1.7–7.7)
Neutrophils Relative %: 76 %
Platelets: 260 10*3/uL (ref 150–400)
RBC: 5.15 MIL/uL (ref 4.22–5.81)
RDW: 12 % (ref 11.5–15.5)
WBC: 10.4 10*3/uL (ref 4.0–10.5)
nRBC: 0 % (ref 0.0–0.2)

## 2020-02-29 LAB — MAGNESIUM: Magnesium: 2.4 mg/dL (ref 1.7–2.4)

## 2020-02-29 LAB — URINALYSIS, ROUTINE W REFLEX MICROSCOPIC
Bilirubin Urine: NEGATIVE
Glucose, UA: NEGATIVE mg/dL
Hgb urine dipstick: NEGATIVE
Ketones, ur: 80 mg/dL — AB
Leukocytes,Ua: NEGATIVE
Nitrite: NEGATIVE
Protein, ur: NEGATIVE mg/dL
Specific Gravity, Urine: 1.009 (ref 1.005–1.030)
pH: 5 (ref 5.0–8.0)

## 2020-02-29 LAB — RESPIRATORY PANEL BY RT PCR (FLU A&B, COVID)
Influenza A by PCR: NEGATIVE
Influenza B by PCR: NEGATIVE
SARS Coronavirus 2 by RT PCR: NEGATIVE

## 2020-02-29 LAB — TSH: TSH: 1.962 u[IU]/mL (ref 0.350–4.500)

## 2020-02-29 LAB — TROPONIN I (HIGH SENSITIVITY): Troponin I (High Sensitivity): 3 ng/L (ref <18)

## 2020-02-29 MED ORDER — SODIUM CHLORIDE 0.9 % IV BOLUS
1000.0000 mL | Freq: Once | INTRAVENOUS | Status: AC
  Administered 2020-02-29: 1000 mL via INTRAVENOUS

## 2020-02-29 NOTE — Discharge Instructions (Addendum)
Continue taking home medications as prescribed.  Make sure you are staying well-hydrated water. Follow-up with your primary care doctor as needed for further evaluation of your symptoms. You may follow-up with the cardiology office as needed for further investigation of your palpitations/racing heart rate. Return to the ER with any new, worsening, or concerning symptoms.

## 2020-02-29 NOTE — ED Provider Notes (Signed)
[facility]-EMERGENCY DEPT Provider Note   CSN: [id number] Arrival date & time: 02/29/20  1109     History Chief Complaint  Patient presents with  . Palpitations  . Nausea    Derek Lee is a 44 y.o. male presenting for evaluation of palpitations, nausea, diaphoresis.  Patient states he has been having episodes over the past several days of palpitations, nausea, diaphoresis.  The symptoms resolve when he takes aspirin.  He had an episode today, prompting his ER visit.  He states he has noted his blood pressure has been elevated, up to 160 systolic.  He does not report a history of hypertension.  He states his only medical history is gout for which he takes allopurinol.  He states he had a sore throat last week, but denies fevers, chills, cough or shortness of breath. No abd pain.  He denies chest pain with his symptoms.  He reports decreased urination today, and states his bowel movements have been loose.  She denies tobacco, alcohol, drug use.  He states he recently saw his PCP, had overall reassuring blood work.  HPI     Past Medical History:  Diagnosis Date  . Asthma   . Gout   . Hyperlipidemia   . Sleep apnea    noncompliant with cpap    Patient Active Problem List   Diagnosis Date Noted  . Fracture of humeral shaft, right, closed 02/16/2017  . Hyperlipidemia     Past Surgical History:  Procedure Laterality Date  . ORIF HUMERUS FRACTURE Right 02/16/2017   Procedure: OPEN REDUCTION INTERNAL FIXATION (ORIF) RIGHT HUMERAL SHAFT FRACTURE;  Surgeon: [name], MD;  Location: [facility];  Service: Orthopedics;  Laterality: Right;       Family History  Problem Relation Age of Onset  . Diabetes Mother   . High blood pressure Father     Social History   Tobacco Use  . Smoking status: Former Smoker    Quit date: 05/04/2014    Years since quitting: 5.8  . Smokeless tobacco: Never Used  Vaping Use  . Vaping Use: Never  used  Substance Use Topics  . Alcohol use: Not Currently  . Drug use: No    Home Medications Prior to Admission medications   Medication Sig Start Date End Date Taking? Authorizing Provider  allopurinol (ZYLOPRIM) 100 MG tablet Take 1 tablet (100 mg total) by mouth 2 (two) times daily. 05/16/19   Nadara Mustard, MD  Colchicine 0.6 MG CAPS Take 0.6 mg by mouth 2 (two) times daily as needed. 05/16/19   Nadara Mustard, MD  ibuprofen (ADVIL,MOTRIN) 800 MG tablet Take 1 tablet (800 mg total) by mouth every 8 (eight) hours as needed (for knee pain). 11/06/17   Molpus, Jonny Ruiz, MD    Allergies    Patient has no known allergies.  Review of Systems   Review of Systems  Cardiovascular: Positive for palpitations.  Gastrointestinal: Positive for diarrhea and nausea.  Genitourinary: Positive for decreased urine volume.  All other systems reviewed and are negative.   Physical Exam Updated Vital Signs BP 120/73   Pulse 88   Temp 98.3 F (36.8 C) (Oral)   Resp 18   Ht 5\' 7"  (1.702 m)   Wt 47.6 kg   SpO2 99%   BMI 16.45 kg/m   Physical Exam Vitals and nursing note reviewed.  Constitutional:      General: He is not in acute distress.    Appearance:  He is well-developed.     Comments: Resting comfortably in the bed in no acute distress  HENT:     Head: Normocephalic and atraumatic.  Eyes:     General: No scleral icterus.    Extraocular Movements: Extraocular movements intact.     Conjunctiva/sclera: Conjunctivae normal.     Pupils: Pupils are equal, round, and reactive to light.  Cardiovascular:     Rate and Rhythm: Normal rate and regular rhythm.     Pulses: Normal pulses.     Comments: Regular rate rhythm Pulmonary:     Effort: Pulmonary effort is normal. No respiratory distress.     Breath sounds: Normal breath sounds. No wheezing.     Comments: Speaking full sentences.  Clear lung sounds in all fields. Abdominal:     General: There is no distension.     Palpations: Abdomen  is soft. There is no mass.     Tenderness: There is no abdominal tenderness. There is no guarding or rebound.     Comments: No TTP of the abdomen.  No rigidity, guarding, distention.  Musculoskeletal:        General: Normal range of motion.     Cervical back: Normal range of motion and neck supple.     Comments: No leg pain or swelling  Skin:    General: Skin is warm and dry.     Capillary Refill: Capillary refill takes less than 2 seconds.     Coloration: Skin is not jaundiced.     Comments: No obvious jaundice  Neurological:     Mental Status: He is alert and oriented to person, place, and time.     ED Results / Procedures / Treatments   Labs (all labs ordered are listed, but only abnormal results are displayed) Labs Reviewed  CBC WITH DIFFERENTIAL/PLATELET - Abnormal; Notable for the following components:      Result Value   Neutro Abs 7.8 (*)    All other components within normal limits  COMPREHENSIVE METABOLIC PANEL - Abnormal; Notable for the following components:   Sodium 134 (*)    Chloride 96 (*)    CO2 20 (*)    Total Protein 9.2 (*)    Total Bilirubin 1.6 (*)    Anion gap 18 (*)    All other components within normal limits  RESPIRATORY PANEL BY RT PCR (FLU A&B, COVID)  MAGNESIUM  TSH  URINALYSIS, ROUTINE W REFLEX MICROSCOPIC  TROPONIN I (HIGH SENSITIVITY)    EKG EKG Interpretation  Date/Time:  Wednesday February 29 2020 12:19:02 EDT Ventricular Rate:  92 PR Interval:    QRS Duration: 86 QT Interval:  341 QTC Calculation: 422 R Axis:   56 Text Interpretation: Sinus rhythm Probable left atrial enlargement Low voltage, extremity leads 12 Lead; Mason-Likar no significant change since earlier in the day or 2016 Confirmed by Pricilla Loveless [phone]) on 02/29/2020 12:37:44 PM   Radiology DG Chest 2 View  Result Date: 02/29/2020 CLINICAL DATA:  Palpitations.  Tachycardia.  Nausea. EXAM: CHEST - 2 VIEW COMPARISON:  05/05/2015 FINDINGS: The heart size and  mediastinal contours are within normal limits. Both lungs are clear. No pleural effusions or pneumothorax. The visualized skeletal structures are unremarkable. IMPRESSION: No acute cardiopulmonary disease. Electronically Signed   By: Feliberto Harts MD   On: 02/29/2020 12:03    Procedures Procedures (including critical care time)  Medications Ordered in ED Medications  sodium chloride 0.9 % bolus 1,000 mL (1,000 mLs Intravenous New Bag/Given 02/29/20 1208)  ED Course  I have reviewed the triage vital signs and the nursing notes.  Pertinent labs & imaging results that were available during my care of the patient were reviewed by me and considered in my medical decision making (see chart for details).    MDM Rules/Calculators/A&P                          Patient presenting for evaluation of intermittent episodes of chest pain, nausea, diaphoresis.  These episodes resolve when he takes aspirin.  On exam, patient peers nontoxic.  Cardiac exam without arrhythmia.  Patient without symptoms currently.  Per EMS, there is some concern for jaundice, however patient without scleral icterus and jaundice on my exam.  Patient reports intermittent episodes of hypertension for the past several days, however blood pressure is reassuring in the ED.  Consider electrolyte abnormality.  Consider anemia.  Consider dehydration.  Less likely ACS, however will obtain EKG and troponin to rule out.  History and exam is not consistent with PE, dissection, or pneumothorax.  Fluids given as patient may be dehydrated due to decreased urine output.  Labs interpreted by me, overall reassuring.  No leukocytosis.  Electrolytes stable.  Kidney and liver function normal.  Troponin normal.  EKG shows normal sinus, unchanged from previous.  Chest x-ray viewed interpreted by me, no pneumonia, pnx, effusion, cardiomegaly. TSH is normal. HR improved with fluids. UA pending.   UA negative for infection.  Discussed findings with  patient.  Discussed follow-up with PCP as needed for blood pressure management and follow-up with cardiology as needed for further evaluation of palpitations.  At this time, patient appears safe for discharge.  Return precautions given.  Patient states she understands and agrees to plan.  Final Clinical Impression(s) / ED Diagnoses Final diagnoses:  Palpitations    Rx / DC Orders ED Discharge Orders    None       Alveria Apley, PA-C 02/29/20 1532    Pricilla Loveless, MD 03/02/20 [phone]

## 2020-02-29 NOTE — ED Triage Notes (Signed)
Patient was found by EMS sitting in his car outside of a store. While in the store the patient felt palpitations, was diaphoretic and nauseous. He denies pain. The patient has experienced these symptoms for 6 days on and off. Aspirin and rest has helped him. EMS noticed the patient appears jaundice.     EMS vitals 120-> 100 HR (initial -> final) 97% SPO2 150/102-> 136/92 BP (initial -> final)

## 2020-03-14 ENCOUNTER — Ambulatory Visit: Payer: 59 | Admitting: Podiatry

## 2020-03-14 ENCOUNTER — Telehealth: Payer: Self-pay | Admitting: Podiatry

## 2020-03-14 NOTE — Telephone Encounter (Signed)
He has posterior tibial tendinitis.  He will need general evaluation and treatment, strengthening/gait training.  I am not sure what happened to his referral sheet that I gave it to him.

## 2020-03-14 NOTE — Telephone Encounter (Signed)
Good Morning,   [facility] called inquiring about referral for patient.Derek KitchenMarland KitchenPlease advise

## 2020-03-28 ENCOUNTER — Ambulatory Visit: Payer: 59 | Admitting: Podiatry

## 2020-04-05 ENCOUNTER — Other Ambulatory Visit: Payer: Self-pay | Admitting: Orthopedic Surgery

## 2020-04-11 ENCOUNTER — Ambulatory Visit: Payer: 59 | Admitting: Podiatry

## 2020-04-25 ENCOUNTER — Ambulatory Visit: Payer: 59 | Admitting: Podiatry

## 2020-05-11 ENCOUNTER — Ambulatory Visit (INDEPENDENT_AMBULATORY_CARE_PROVIDER_SITE_OTHER): Payer: 59 | Admitting: Podiatry

## 2020-05-11 ENCOUNTER — Other Ambulatory Visit: Payer: Self-pay

## 2020-05-11 DIAGNOSIS — M778 Other enthesopathies, not elsewhere classified: Secondary | ICD-10-CM | POA: Diagnosis not present

## 2020-05-15 ENCOUNTER — Encounter: Payer: Self-pay | Admitting: Podiatry

## 2020-05-15 NOTE — Progress Notes (Signed)
Subjective:  Patient ID: Derek Lee, male    DOB: [birth year],  MRN: [id number]  Chief Complaint  Patient presents with  . Capsulitis    Right foot pain that has reoccurred since last visit. Patient has marked the spot with a sharpie for injection location     44 y.o. male presents with the above complaint.  Patient presents with a follow-up of right dorsal medial foot pain likely due to insertional posterior tibial tendinitis at the talonavicular joint.  Patient states is doing a lot better with the injection.  He states the injection helped.  He would like to do another one has got about 80% relief.   Review of Systems: Negative except as noted in the HPI. Denies N/V/F/Ch.  Past Medical History:  Diagnosis Date  . Asthma   . Gout   . Hyperlipidemia   . Sleep apnea    noncompliant with cpap    Current Outpatient Medications:  .  allopurinol (ZYLOPRIM) 100 MG tablet, Take 1 tablet (100 mg total) by mouth 2 (two) times daily., Disp: 60 tablet, Rfl: 3 .  Colchicine 0.6 MG CAPS, Take 0.6 mg by mouth 2 (two) times daily as needed., Disp: 60 capsule, Rfl: 3 .  ibuprofen (ADVIL,MOTRIN) 800 MG tablet, Take 1 tablet (800 mg total) by mouth every 8 (eight) hours as needed (for knee pain)., Disp: 21 tablet, Rfl: 0  Social History   Tobacco Use  Smoking Status Former Smoker  . Quit date: 05/04/2014  . Years since quitting: 6.0  Smokeless Tobacco Never Used    No Known Allergies Objective:  There were no vitals filed for this visit. There is no height or weight on file to calculate BMI. Constitutional Well developed. Well nourished.  Vascular Dorsalis pedis pulses palpable bilaterally. Posterior tibial pulses palpable bilaterally. Capillary refill normal to all digits.  No cyanosis or clubbing noted. Pedal hair growth normal.  Neurologic Normal speech. Oriented to person, place, and time. Epicritic sensation to light touch grossly present bilaterally.  Dermatologic Nails  well groomed and normal in appearance. No open wounds. No skin lesions.  Orthopedic:  No pain on palpation localized to the right lateral foot around the midshaft of the fourth metatarsal and left dorsal midfoot.  No pain on palpation to the both of the sites.  No pain with extensor and flexor range of motion.  No pain with resisted dorsiflexion and plantarflexion of the digits.  Pain on palpation at the insertion of the posterior tibial tendon.  No pain along the course of the tendon.  Pain with dorsiflexion eversion of the foot resisted.  No pain with plantarflexion inversion of the foot.   Radiographs: None Assessment:   1. Capsulitis of right foot    Plan:  Patient was evaluated and treated and all questions answered.  Right posterior tibial tendinitis -I explained to the patient the etiology of tendinitis and various treatment options were discussed.  Given the amount of pain that is having I believe will benefit from steroid injection help decrease the acute inflammatory component with pain.  Physical therapy will also help with this as well. -A steroid injection was performed at right medial foot using 1% plain Lidocaine and 10 mg of Kenalog. This was well tolerated.   Right lateral midfoot arthritis as well as left dorsal midfoot arthritis each with underlying capsulitis -Clinically resolved on both sides of the midfoot.  Rheumatoid panel was reviewed with the patient which was negative. -He will continue with physical therapy  for generalized weakness to both lower extremity as well as gait training and strengthening.  No follow-ups on file.

## 2020-06-06 ENCOUNTER — Ambulatory Visit: Payer: 59 | Admitting: Podiatry

## 2020-06-20 ENCOUNTER — Ambulatory Visit: Payer: 59 | Admitting: Podiatry

## 2020-07-04 ENCOUNTER — Ambulatory Visit: Payer: 59 | Admitting: Podiatry

## 2020-07-11 ENCOUNTER — Ambulatory Visit: Payer: 59 | Admitting: Podiatry

## 2020-07-25 ENCOUNTER — Ambulatory Visit: Payer: 59 | Admitting: Podiatry

## 2020-08-08 ENCOUNTER — Other Ambulatory Visit: Payer: Self-pay

## 2020-08-08 ENCOUNTER — Ambulatory Visit (INDEPENDENT_AMBULATORY_CARE_PROVIDER_SITE_OTHER): Payer: 59 | Admitting: Podiatry

## 2020-08-08 DIAGNOSIS — M778 Other enthesopathies, not elsewhere classified: Secondary | ICD-10-CM

## 2020-08-14 ENCOUNTER — Encounter: Payer: Self-pay | Admitting: Podiatry

## 2020-08-14 NOTE — Progress Notes (Signed)
  Subjective:  Patient ID: Derek Lee, male    DOB: Sep 13, 1975,  MRN: [id number]  Chief Complaint  Patient presents with  . Foot Pain    PT stated that he is still having pain with his feet     45 y.o. male presents with the above complaint.  Patient presents with steroid injection to the mid dorsal midfoot bilaterally.  Follow-up.  She is he states that he is doing well.  The injection helped considerably.  He would like to do another one as the previous injection lasted for few months.  He denies any other acute complaints.   Review of Systems: Negative except as noted in the HPI. Denies N/V/F/Ch.  Past Medical History:  Diagnosis Date  . Asthma   . Gout   . Hyperlipidemia   . Sleep apnea    noncompliant with cpap    Current Outpatient Medications:  .  allopurinol (ZYLOPRIM) 100 MG tablet, Take 1 tablet (100 mg total) by mouth 2 (two) times daily., Disp: 60 tablet, Rfl: 3 .  Colchicine 0.6 MG CAPS, Take 0.6 mg by mouth 2 (two) times daily as needed., Disp: 60 capsule, Rfl: 3 .  ibuprofen (ADVIL,MOTRIN) 800 MG tablet, Take 1 tablet (800 mg total) by mouth every 8 (eight) hours as needed (for knee pain)., Disp: 21 tablet, Rfl: 0  Social History   Tobacco Use  Smoking Status Former Smoker  . Quit date: 05/04/2014  . Years since quitting: 6.2  Smokeless Tobacco Never Used    No Known Allergies Objective:  There were no vitals filed for this visit. There is no height or weight on file to calculate BMI. Constitutional Well developed. Well nourished.  Vascular Dorsalis pedis pulses palpable bilaterally. Posterior tibial pulses palpable bilaterally. Capillary refill normal to all digits.  No cyanosis or clubbing noted. Pedal hair growth normal.  Neurologic Normal speech. Oriented to person, place, and time. Epicritic sensation to light touch grossly present bilaterally.  Dermatologic Nails well groomed and normal in appearance. No open wounds. No skin lesions.   Orthopedic:  Pain on palpation localized to the right lateral foot around the midshaft of the fourth metatarsal and left dorsal midfoot.  Pain on palpation to the both of the sites.  No pain with extensor and flexor range of motion.  No pain with resisted dorsiflexion and plantarflexion of the digits.   Radiographs: None Assessment:   1. Capsulitis of right foot   2. Capsulitis of left foot    Plan:  Patient was evaluated and treated and all questions answered.  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  I believe patient will benefit from a steroid injection to help decrease the acute inflammatory component with pain.  Patient agrees with the plan would like to proceed with a steroid injection to both of those spots. -A  steroid injection was performed at right lateral foot at the point of maximal tenderness using 1% plain Lidocaine and 10 mg of Kenalog. This was well tolerated. -A steroid injection was performed at left dorsal midfoot using 1% plain Lidocaine and 10 mg of Kenalog. This was well tolerated.   No follow-ups on file.

## 2020-08-29 ENCOUNTER — Ambulatory Visit: Payer: 59 | Admitting: Podiatry

## 2020-09-06 ENCOUNTER — Ambulatory Visit: Payer: 59 | Admitting: Podiatry

## 2020-09-13 ENCOUNTER — Ambulatory Visit: Payer: 59 | Admitting: Podiatry

## 2020-09-28 ENCOUNTER — Telehealth: Payer: Self-pay | Admitting: Podiatry

## 2020-09-28 ENCOUNTER — Other Ambulatory Visit: Payer: Self-pay

## 2020-09-28 ENCOUNTER — Ambulatory Visit (INDEPENDENT_AMBULATORY_CARE_PROVIDER_SITE_OTHER): Payer: 59 | Admitting: Podiatry

## 2020-09-28 DIAGNOSIS — M778 Other enthesopathies, not elsewhere classified: Secondary | ICD-10-CM

## 2020-09-28 NOTE — Telephone Encounter (Signed)
The following patient has requested renewal for handicap plaque, patient stated he has appointment today and if possible would like to take care of this prior to arrival, Please Advise

## 2020-10-01 NOTE — Telephone Encounter (Signed)
I think its already done

## 2020-10-03 ENCOUNTER — Encounter: Payer: Self-pay | Admitting: Podiatry

## 2020-10-03 NOTE — Progress Notes (Signed)
  Subjective:  Patient ID: Derek Lee, male    DOB: 19-Jan-1976,  MRN: [id number]  Chief Complaint  Patient presents with  . Capsulitis    Capsulitis of both feet. Pt states improvement with occasional sharp pains in joints.     45 y.o. male presents with the above complaint.  Patient presents with steroid injection to the mid dorsal midfoot bilaterally.  Follow-up.  She is he states that he is doing well.  The injection helped considerably.  He would like to do another one as the previous injection lasted for few months.  He denies any other acute complaints.   Review of Systems: Negative except as noted in the HPI. Denies N/V/F/Ch.  Past Medical History:  Diagnosis Date  . Asthma   . Gout   . Hyperlipidemia   . Sleep apnea    noncompliant with cpap    Current Outpatient Medications:  .  allopurinol (ZYLOPRIM) 100 MG tablet, Take 1 tablet (100 mg total) by mouth 2 (two) times daily., Disp: 60 tablet, Rfl: 3 .  Colchicine 0.6 MG CAPS, Take 0.6 mg by mouth 2 (two) times daily as needed., Disp: 60 capsule, Rfl: 3 .  ibuprofen (ADVIL,MOTRIN) 800 MG tablet, Take 1 tablet (800 mg total) by mouth every 8 (eight) hours as needed (for knee pain)., Disp: 21 tablet, Rfl: 0  Social History   Tobacco Use  Smoking Status Former Smoker  . Quit date: 05/04/2014  . Years since quitting: 6.4  Smokeless Tobacco Never Used    No Known Allergies Objective:  There were no vitals filed for this visit. There is no height or weight on file to calculate BMI. Constitutional Well developed. Well nourished.  Vascular Dorsalis pedis pulses palpable bilaterally. Posterior tibial pulses palpable bilaterally. Capillary refill normal to all digits.  No cyanosis or clubbing noted. Pedal hair growth normal.  Neurologic Normal speech. Oriented to person, place, and time. Epicritic sensation to light touch grossly present bilaterally.  Dermatologic Nails well groomed and normal in appearance. No  open wounds. No skin lesions.  Orthopedic:  Mild pain on palpation localized to the right lateral foot around the midshaft of the fourth metatarsal and left dorsal midfoot.  Mild pain on palpation to the both of the sites.  No pain with extensor and flexor range of motion.  No pain with resisted dorsiflexion and plantarflexion of the digits.   Radiographs: None Assessment:   1. Capsulitis of right foot   2. Capsulitis of left foot    Plan:  Patient was evaluated and treated and all questions answered.  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  However patient would like to undergo PRP injections during next clinical visit to help with some of the pain.  I will discuss this further during next clinical visit.  He is an ideal candidate for it as he still has very chronic pain.  We will hold off on steroid shots for now.  No follow-ups on file.

## 2020-10-16 ENCOUNTER — Telehealth: Payer: Self-pay | Admitting: Podiatry

## 2020-10-16 NOTE — Telephone Encounter (Signed)
Patient is interested in PRP injection procedure. He is requesting further information regarding the procedure, when it will be offered as a service, and cost as well. Patient is requesting a call back from Dr. Allena Katz.

## 2020-10-17 NOTE — Telephone Encounter (Signed)
Derek Lee is setting up the training day. We can complete it with a loaner machine when would you like to schedule? I just need to make sure, to have the machine here until our replacement machine arrives. The cost Derek Lee be 350 dollars and insurance doesn't cover it.

## 2020-10-17 NOTE — Telephone Encounter (Signed)
Hi Keely can you schedule this patient for PRP injection.  We can also contact the [employer] rep so they are here and available for it as well.

## 2020-10-26 ENCOUNTER — Ambulatory Visit: Payer: 59 | Admitting: Podiatry

## 2020-11-09 ENCOUNTER — Ambulatory Visit: Payer: 59 | Admitting: Podiatry

## 2020-11-12 ENCOUNTER — Telehealth: Payer: Self-pay

## 2020-11-12 NOTE — Telephone Encounter (Signed)
Pt would like to receive a call to cover questions about the PRP. Please advise asap. This procedure is schedule for Wednesday 10/25/20 at 11am.

## 2020-11-14 ENCOUNTER — Ambulatory Visit: Payer: 59

## 2020-11-23 ENCOUNTER — Ambulatory Visit (INDEPENDENT_AMBULATORY_CARE_PROVIDER_SITE_OTHER): Payer: 59 | Admitting: Podiatry

## 2020-11-23 ENCOUNTER — Ambulatory Visit (INDEPENDENT_AMBULATORY_CARE_PROVIDER_SITE_OTHER): Payer: 59

## 2020-11-23 ENCOUNTER — Other Ambulatory Visit: Payer: Self-pay

## 2020-11-23 DIAGNOSIS — M778 Other enthesopathies, not elsewhere classified: Secondary | ICD-10-CM | POA: Diagnosis not present

## 2020-11-23 DIAGNOSIS — M19072 Primary osteoarthritis, left ankle and foot: Secondary | ICD-10-CM | POA: Diagnosis not present

## 2020-11-23 DIAGNOSIS — M19079 Primary osteoarthritis, unspecified ankle and foot: Secondary | ICD-10-CM

## 2020-11-27 NOTE — Progress Notes (Signed)
  Subjective:  Patient ID: Derek Lee, male    DOB: Jun 30, 1975,  MRN: [id number]  Chief Complaint  Patient presents with   Foot Pain    PT stated that he is doing well     45 y.o. male presents with the above complaint.  Patient presents with steroid injection to the mid dorsal midfoot bilaterally.  Follow-up.  She is he states that he is doing well.  The injection helped considerably.  He would like to do another one as the previous injection lasted for few months.  He denies any other acute complaints.   Review of Systems: Negative except as noted in the HPI. Denies N/V/F/Ch.  Past Medical History:  Diagnosis Date   Asthma    Gout    Hyperlipidemia    Sleep apnea    noncompliant with cpap    Current Outpatient Medications:    allopurinol (ZYLOPRIM) 100 MG tablet, Take 1 tablet (100 mg total) by mouth 2 (two) times daily., Disp: 60 tablet, Rfl: 3   Colchicine 0.6 MG CAPS, Take 0.6 mg by mouth 2 (two) times daily as needed., Disp: 60 capsule, Rfl: 3   ibuprofen (ADVIL,MOTRIN) 800 MG tablet, Take 1 tablet (800 mg total) by mouth every 8 (eight) hours as needed (for knee pain)., Disp: 21 tablet, Rfl: 0  Social History   Tobacco Use  Smoking Status Former   Pack years: 0.00   Types: Cigarettes   Quit date: 05/04/2014   Years since quitting: 6.5  Smokeless Tobacco Never    No Known Allergies Objective:  There were no vitals filed for this visit. There is no height or weight on file to calculate BMI. Constitutional Well developed. Well nourished.  Vascular Dorsalis pedis pulses palpable bilaterally. Posterior tibial pulses palpable bilaterally. Capillary refill normal to all digits.  No cyanosis or clubbing noted. Pedal hair growth normal.  Neurologic Normal speech. Oriented to person, place, and time. Epicritic sensation to light touch grossly present bilaterally.  Dermatologic Nails well groomed and normal in appearance. No open wounds. No skin lesions.   Orthopedic:  Mild pain on palpation localized to the right lateral foot around the midshaft of the fourth metatarsal and left dorsal midfoot.  Mild pain on palpation to the both of the sites.  No pain with extensor and flexor range of motion.  No pain with resisted dorsiflexion and plantarflexion of the digits.   Radiographs: None Assessment:   1. Capsulitis of right foot   2. Arthritis of ankle or foot, degenerative, left   3. Arthritis of midfoot    Plan:  Patient was evaluated and treated and all questions answered.  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  I discussed with him that given that his pain is doing well we will hold off on steroid injection at this time.  He does have generalized weakness to both lower extremity while ambulating.  I believe patient will benefit from physical therapy. -Prescription for physical therapy was given for general deconditioning -I also discussed with the patient that he may benefit from PRP injections and once he is ready we can get him on the schedule for PRP injection.  He states understanding.  No follow-ups on file.

## 2021-01-21 IMAGING — MR MR HEAD W/O CM
10 series · 48 of 48 positions shown · non-contrast
Comparison: None.

CLINICAL DATA: Ataxia.

EXAM:
MRI HEAD WITHOUT CONTRAST
TECHNIQUE: Multiplanar, multiecho pulse sequences of the brain and surrounding
structures were obtained without intravenous contrast.

[Series 6: T1 · sagittal · 5.0mm · 0.47mm/px · 1 of 23 slices shown]
[im 1/23]
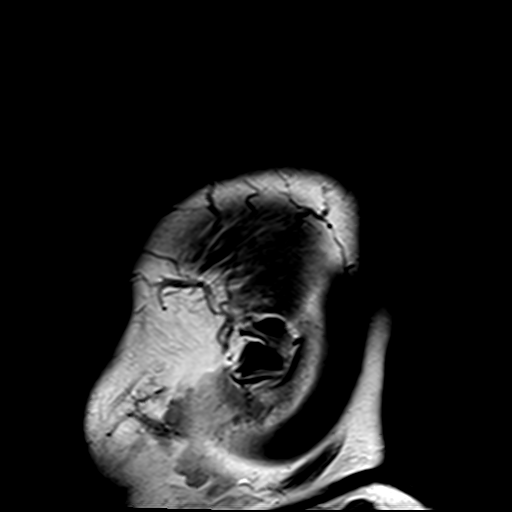

[Series 7: DWI · axial · 3.0mm · 1.88mm/px · z∈[-47,+100]mm · 9 of 100 slices shown (1 of 4)]
[im 1/100]
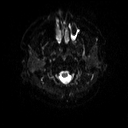
[im 13/100]
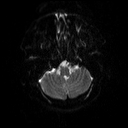
[im 25/100]
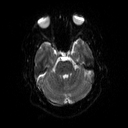
[im 38/100]
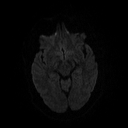
[im 50/100]
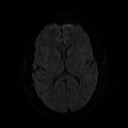
[im 62/100]
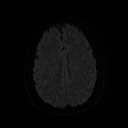
[im 75/100]
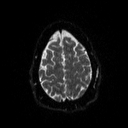
[im 87/100]
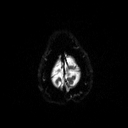
[im 100/100]
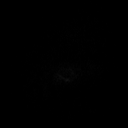

[Series 8: DWI · axial · 3.0mm · 1.88mm/px · z∈[-47,+100]mm · 4 of 50 slices shown (2 of 4)]
[im 1/50]
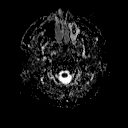
[im 17/50]
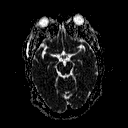
[im 33/50]
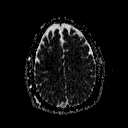
[im 50/50]
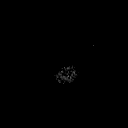

[Series 9: DWI · coronal · 5.0mm · 1.80mm/px · 7 of 76 slices shown (3 of 4)]
[im 1/76]
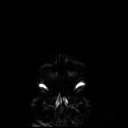
[im 13/76]
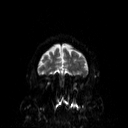
[im 26/76]
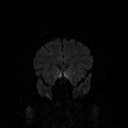
[im 38/76]
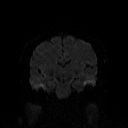
[im 51/76]
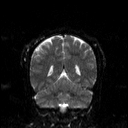
[im 63/76]
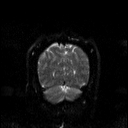
[im 76/76]
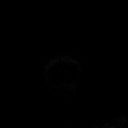

[Series 10: DWI · coronal · 5.0mm · 1.80mm/px · 3 of 38 slices shown (4 of 4)]
[im 1/38]
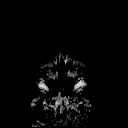
[im 19/38]
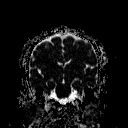
[im 38/38]
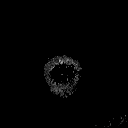

[Series 11: FLAIR · axial · 3.0mm · 0.47mm/px · z∈[-50,+98]mm · 3 of 33 slices shown]
[im 1/33]
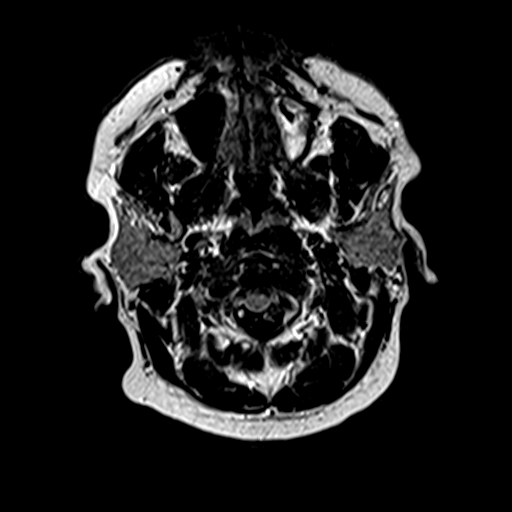
[im 17/33]
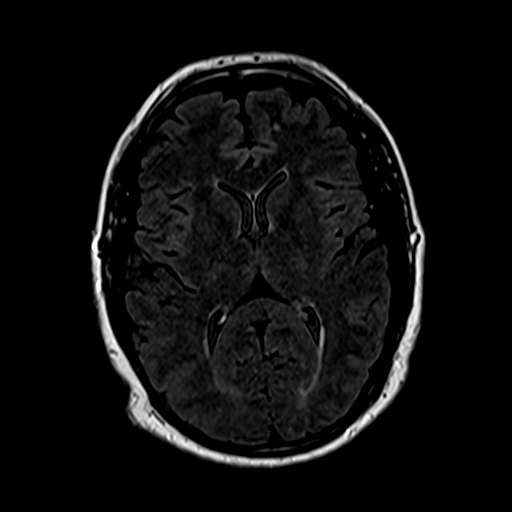
[im 33/33]
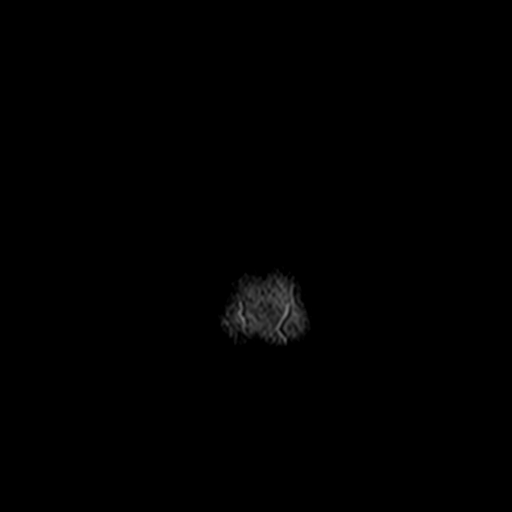

[Series 12: T2 · axial · 5.0mm · 0.62mm/px · z∈[-49,+98]mm · 2 of 23 slices shown (1 of 2)]
[im 1/23]
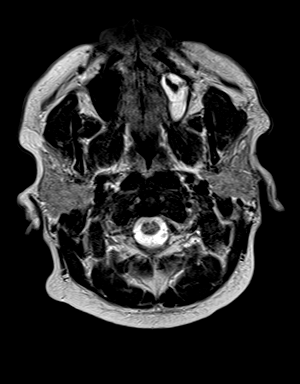
[im 23/23]
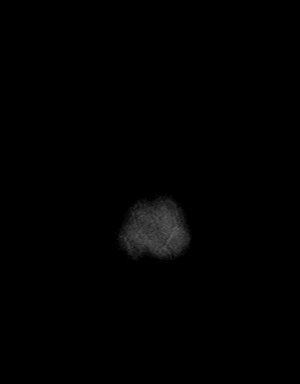

[Series 14: swi_images · axial · 5.0mm · 0.94mm/px · z∈[-48,+96]mm · 3 of 30 slices shown]
[im 1/30]
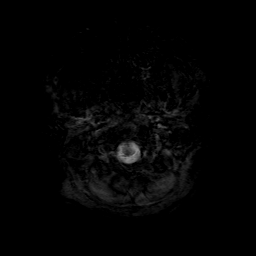
[im 15/30]
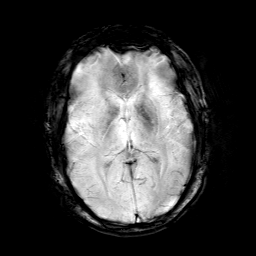
[im 30/30]
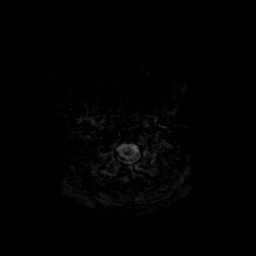

[Series 15: t1_mpr_tra · axial · 1.0mm · 0.75mm/px · z∈[-46,+96]mm · 13 of 144 slices shown]
[im 1/144]
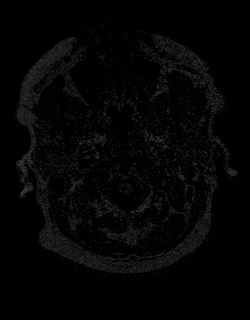
[im 12/144]
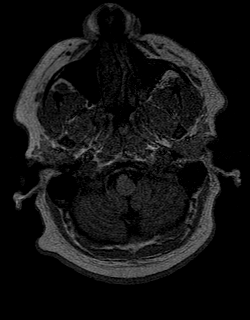
[im 24/144]
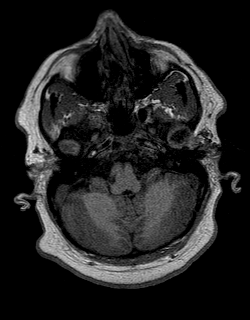
[im 36/144]
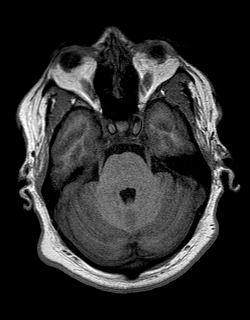
[im 48/144]
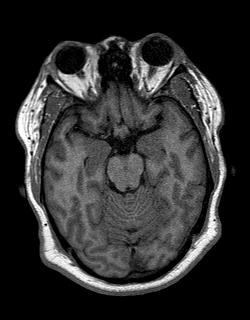
[im 60/144]
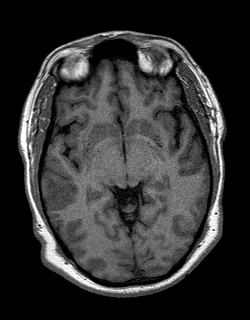
[im 72/144]
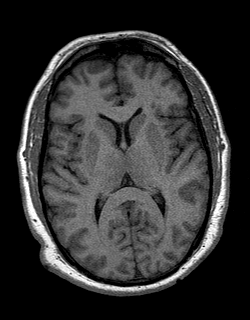
[im 84/144]
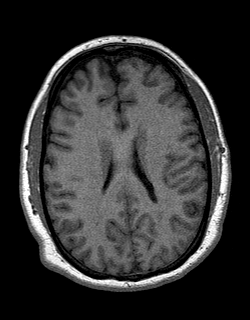
[im 96/144]
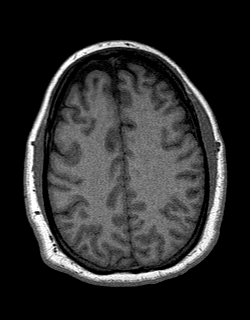
[im 108/144]
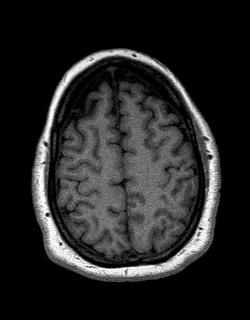
[im 120/144]
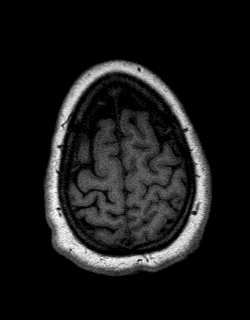
[im 132/144]
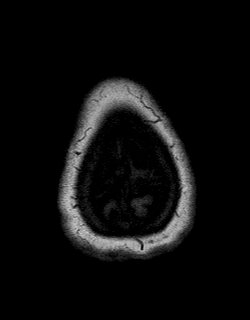
[im 144/144]
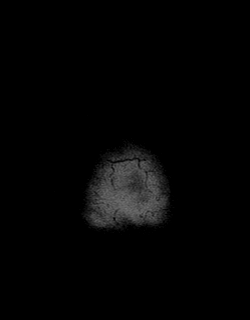

[Series 16: T2 · coronal · 5.0mm · 0.45mm/px · 3 of 29 slices shown (2 of 2)]
[im 1/29]
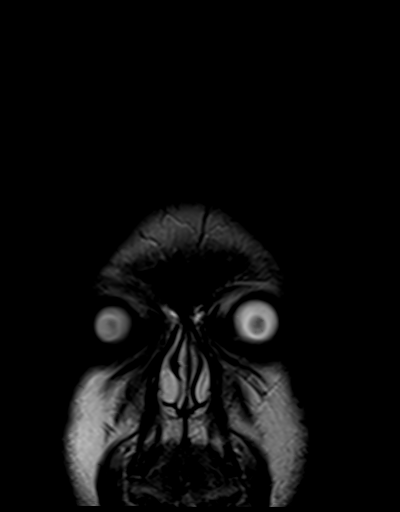
[im 15/29]
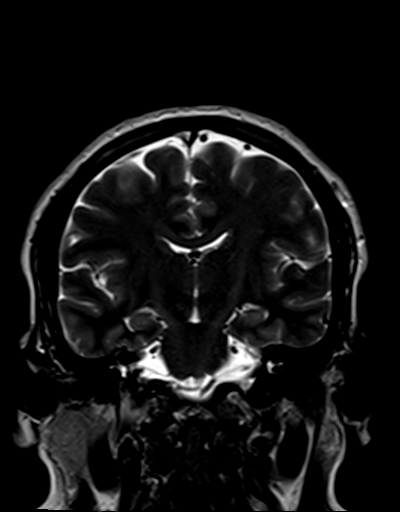
[im 29/29]
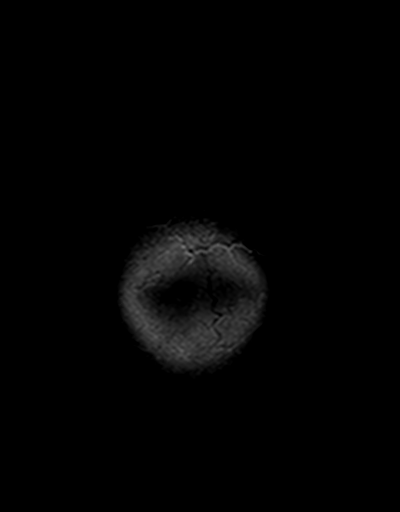

[48 of 48 positions shown; findings below may reference images not displayed]

FINDINGS: Brain: Scattered T2/FLAIR hyperintense foci involving the
subcortical, periventricular and infratentorial white matter. No
acute infarct or intracranial hemorrhage. No midline shift,
ventriculomegaly or extra-axial fluid collection. No mass lesion.

Vascular: Normal flow voids.

Skull and upper cervical spine: Normal marrow signal.

Sinuses/Orbits: Normal orbits. Mild left maxillary and ethmoid sinus
disease. No mastoid effusion.

Other: None.
IMPRESSION: No acute intracranial process.

Peripheral predominant supratentorial and infratentorial white
matter T2 hyperintensities. Differential includes vasculitis,
demyelinating foci, chronic microvascular ischemic changes and post
infectious/inflammatory sequela.

Ethmoid and left maxillary sinus disease.

## 2021-03-13 ENCOUNTER — Ambulatory Visit (INDEPENDENT_AMBULATORY_CARE_PROVIDER_SITE_OTHER): Payer: 59 | Admitting: Podiatry

## 2021-03-13 ENCOUNTER — Other Ambulatory Visit: Payer: Self-pay

## 2021-03-13 DIAGNOSIS — M778 Other enthesopathies, not elsewhere classified: Secondary | ICD-10-CM

## 2021-03-13 DIAGNOSIS — M19072 Primary osteoarthritis, left ankle and foot: Secondary | ICD-10-CM

## 2021-03-20 NOTE — Progress Notes (Signed)
  Subjective:  Patient ID: Derek Lee, male    DOB: April 20, 1976,  MRN: [id number]  Chief Complaint  Patient presents with   Foot Pain    PT stated that he is having some issues with his foot still     45 y.o. male presents with the above complaint.  Patient presents with steroid injection to the mid dorsal midfoot bilaterally.  Patient states he was unable to go to physical therapy but given that he still has some right foot pain he can schedule it.  Review of Systems: Negative except as noted in the HPI. Denies N/V/F/Ch.  Past Medical History:  Diagnosis Date   Asthma    Gout    Hyperlipidemia    Sleep apnea    noncompliant with cpap    Current Outpatient Medications:    allopurinol (ZYLOPRIM) 100 MG tablet, Take 1 tablet (100 mg total) by mouth 2 (two) times daily., Disp: 60 tablet, Rfl: 3   Colchicine 0.6 MG CAPS, Take 0.6 mg by mouth 2 (two) times daily as needed., Disp: 60 capsule, Rfl: 3   ibuprofen (ADVIL,MOTRIN) 800 MG tablet, Take 1 tablet (800 mg total) by mouth every 8 (eight) hours as needed (for knee pain)., Disp: 21 tablet, Rfl: 0  Social History   Tobacco Use  Smoking Status Former   Types: Cigarettes   Quit date: 05/04/2014   Years since quitting: 6.8  Smokeless Tobacco Never    No Known Allergies Objective:  There were no vitals filed for this visit. There is no height or weight on file to calculate BMI. Constitutional Well developed. Well nourished.  Vascular Dorsalis pedis pulses palpable bilaterally. Posterior tibial pulses palpable bilaterally. Capillary refill normal to all digits.  No cyanosis or clubbing noted. Pedal hair growth normal.  Neurologic Normal speech. Oriented to person, place, and time. Epicritic sensation to light touch grossly present bilaterally.  Dermatologic Nails well groomed and normal in appearance. No open wounds. No skin lesions.  Orthopedic:  Mild pain on palpation localized to the right lateral foot around the  midshaft of the fourth metatarsal and left dorsal midfoot.  Mild pain on palpation to the both of the sites.  No pain with extensor and flexor range of motion.  No pain with resisted dorsiflexion and plantarflexion of the digits.   Radiographs: None Assessment:   No diagnosis found.  Plan:  Patient was evaluated and treated and all questions answered.  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  I discussed with him that given that his pain is doing well we will hold off on steroid injection at this time.  He does have generalized weakness to both lower extremity while ambulating.  I believe patient will benefit from physical therapy. -Begin doing physical therapy for general deconditioning. -I also discussed with the patient that he may benefit from PRP injections and once he is ready we can get him on the schedule for PRP injection.  He states understanding.  No follow-ups on file.

## 2021-04-24 ENCOUNTER — Other Ambulatory Visit: Payer: Self-pay

## 2021-04-24 ENCOUNTER — Ambulatory Visit (INDEPENDENT_AMBULATORY_CARE_PROVIDER_SITE_OTHER): Payer: 59 | Admitting: Podiatry

## 2021-04-24 DIAGNOSIS — M778 Other enthesopathies, not elsewhere classified: Secondary | ICD-10-CM

## 2021-04-26 NOTE — Progress Notes (Signed)
PRP injection was delivered in standard technique to left dorsal midfoot.  4 cc of PRP was given.  No complication noted.

## 2021-05-29 ENCOUNTER — Ambulatory Visit (INDEPENDENT_AMBULATORY_CARE_PROVIDER_SITE_OTHER): Payer: Medicaid Other | Admitting: Podiatry

## 2021-05-29 ENCOUNTER — Other Ambulatory Visit: Payer: Self-pay

## 2021-05-29 DIAGNOSIS — M7751 Other enthesopathy of right foot: Secondary | ICD-10-CM | POA: Diagnosis not present

## 2021-05-29 DIAGNOSIS — M778 Other enthesopathies, not elsewhere classified: Secondary | ICD-10-CM | POA: Diagnosis not present

## 2021-05-29 MED ORDER — COLCHICINE 0.6 MG PO CAPS: 0.6000 mg | ORAL_CAPSULE | Freq: Every day | ORAL | 2 refills | Status: DC

## 2021-05-29 NOTE — Progress Notes (Signed)
°  Subjective:  Patient ID: Derek Lee, male    DOB: 10/29/1975,  MRN: [id number]  Chief Complaint  Patient presents with   Injections    PRP injection follow up     46 y.o. male presents with the above complaint.  Patient states the PRP injection helped considerably.  He states that it has been resolving his foot pain completely.  He is getting more confidence in walking. Review of Systems: Negative except as noted in the HPI. Denies N/V/F/Ch.  Past Medical History:  Diagnosis Date   Asthma    Gout    Hyperlipidemia    Sleep apnea    noncompliant with cpap    Current Outpatient Medications:    Colchicine (MITIGARE) 0.6 MG CAPS, Take 0.6 mg by mouth daily., Disp: 30 capsule, Rfl: 2   allopurinol (ZYLOPRIM) 100 MG tablet, Take 1 tablet (100 mg total) by mouth 2 (two) times daily., Disp: 60 tablet, Rfl: 3   Colchicine 0.6 MG CAPS, Take 0.6 mg by mouth 2 (two) times daily as needed., Disp: 60 capsule, Rfl: 3   ibuprofen (ADVIL,MOTRIN) 800 MG tablet, Take 1 tablet (800 mg total) by mouth every 8 (eight) hours as needed (for knee pain)., Disp: 21 tablet, Rfl: 0  Social History   Tobacco Use  Smoking Status Former   Types: Cigarettes   Quit date: 05/04/2014   Years since quitting: 7.0  Smokeless Tobacco Never    No Known Allergies Objective:  There were no vitals filed for this visit. There is no height or weight on file to calculate BMI. Constitutional Well developed. Well nourished.  Vascular Dorsalis pedis pulses palpable bilaterally. Posterior tibial pulses palpable bilaterally. Capillary refill normal to all digits.  No cyanosis or clubbing noted. Pedal hair growth normal.  Neurologic Normal speech. Oriented to person, place, and time. Epicritic sensation to light touch grossly present bilaterally.  Dermatologic Nails well groomed and normal in appearance. No open wounds. No skin lesions.  Orthopedic: No further pain on palpation localized to the right  lateral foot around the midshaft of the fourth metatarsal and left dorsal midfoot.  No further pain on palpation to the both of the sites.  No pain with extensor and flexor range of motion.  No pain with resisted dorsiflexion and plantarflexion of the digits.   Radiographs: None Assessment:   No diagnosis found.  Plan:  Patient was evaluated and treated and all questions answered.  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  Patient states that patient had complete resolve meant of pain on the lateral midfoot after undergoing PRP injection.  At this time I discussed with him if it comes back or if another area of his foot and ankle bothers him come see me again he states understanding.  No follow-ups on file.

## 2021-07-26 ENCOUNTER — Other Ambulatory Visit: Payer: Self-pay

## 2021-07-26 ENCOUNTER — Ambulatory Visit (INDEPENDENT_AMBULATORY_CARE_PROVIDER_SITE_OTHER): Payer: Medicaid Other | Admitting: Podiatry

## 2021-07-26 DIAGNOSIS — M7751 Other enthesopathy of right foot: Secondary | ICD-10-CM | POA: Diagnosis not present

## 2021-07-26 DIAGNOSIS — M7752 Other enthesopathy of left foot: Secondary | ICD-10-CM

## 2021-07-26 NOTE — Progress Notes (Signed)
?  Subjective:  ?Patient ID: Derek Lee, male    DOB: 24-Jun-1975,  MRN: [id number] ? ?Chief Complaint  ?Patient presents with  ? Follow-up  ?  Pt has a concern with the next PRP injection he will be getting in the next 2 week. Mentioned there is new location he would like injection site to be at   ? ? ?46 y.o. male presents with the above complaint.  Patient states the PRP injection helped considerably.  He has healed on top of the foot.  He is not having ankle pain.  He would like to discuss PRP injection in that area. ? ?Past Medical History:  ?Diagnosis Date  ? Asthma   ? Gout   ? Hyperlipidemia   ? Sleep apnea   ? noncompliant with cpap  ? ? ?Current Outpatient Medications:  ?  metoprolol succinate (TOPROL-XL) 25 MG 24 hr tablet, Take 1 tablet by mouth daily., Disp: , Rfl:  ?  allopurinol (ZYLOPRIM) 100 MG tablet, Take 1 tablet (100 mg total) by mouth 2 (two) times daily., Disp: 60 tablet, Rfl: 3 ?  Colchicine (MITIGARE) 0.6 MG CAPS, Take 0.6 mg by mouth daily., Disp: 30 capsule, Rfl: 2 ?  Colchicine 0.6 MG CAPS, Take 0.6 mg by mouth 2 (two) times daily as needed., Disp: 60 capsule, Rfl: 3 ?  ibuprofen (ADVIL,MOTRIN) 800 MG tablet, Take 1 tablet (800 mg total) by mouth every 8 (eight) hours as needed (for knee pain)., Disp: 21 tablet, Rfl: 0 ? ?Social History  ? ?Tobacco Use  ?Smoking Status Former  ? Types: Cigarettes  ? Quit date: 05/04/2014  ? Years since quitting: 7.2  ?Smokeless Tobacco Never  ? ? ?No Known Allergies ?Objective:  ?There were no vitals filed for this visit. ?There is no height or weight on file to calculate BMI. ?Constitutional Well developed. ?Well nourished.  ?Vascular Dorsalis pedis pulses palpable bilaterally. ?Posterior tibial pulses palpable bilaterally. ?Capillary refill normal to all digits.  ?No cyanosis or clubbing noted. ?Pedal hair growth normal.  ?Neurologic Normal speech. ?Oriented to person, place, and time. ?Epicritic sensation to light touch grossly present bilaterally.   ?Dermatologic Nails well groomed and normal in appearance. ?No open wounds. ?No skin lesions.  ?Orthopedic: No further pain on palpation localized to the right lateral foot around the midshaft of the fourth metatarsal and left dorsal midfoot.  No further pain on palpation to the both of the sites.  No pain with extensor and flexor range of motion.  No pain with resisted dorsiflexion and plantarflexion of the digits. ? ?Pain on palpation to bilateral ATFL ligament.  Instability noted especially while ambulating.  Negative Romberg's test.  ? ?Radiographs: None ?Assessment:  ? ?1. Capsulitis of ankle, right   ?2. Capsulitis of ankle, left   ? ? ?Plan:  ?Patient was evaluated and treated and all questions answered. ? ?Bilateral ankle instability/capsulitis ?-I explained the patient the etiology of capsulitis and various treatment options were discussed.  I believe patient will benefit from PRP injection.  He will be scheduled in 2 weeks to undergo another PRP injection ? 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 ?No follow-ups on file.  ?

## 2021-08-06 ENCOUNTER — Other Ambulatory Visit: Payer: Medicaid Other

## 2021-08-06 ENCOUNTER — Telehealth: Payer: Self-pay | Admitting: Podiatry

## 2021-08-06 NOTE — Telephone Encounter (Signed)
Called patient lvm to schedule patient to come in for his PRP. He can come in on 3/15 at 11:30 or Friday on the nurse schedule. If he is unable to come those days then he would need to be scheduled on Dr. Allena Katz schedule  ?

## 2021-08-07 NOTE — Telephone Encounter (Signed)
Called patient lvm to schedule PRP ?

## 2021-08-14 ENCOUNTER — Other Ambulatory Visit: Payer: Medicaid Other

## 2021-09-09 ENCOUNTER — Telehealth: Payer: Self-pay | Admitting: Podiatry

## 2021-09-09 NOTE — Telephone Encounter (Signed)
Pt called and cxled the prp appt for 4.21 and is scheduled to see Dr Posey Pronto to discuss further on 4.28.[name] ?

## 2021-09-13 ENCOUNTER — Other Ambulatory Visit: Payer: Medicaid Other

## 2021-09-20 ENCOUNTER — Encounter: Payer: Self-pay | Admitting: Podiatry

## 2021-09-20 ENCOUNTER — Ambulatory Visit (INDEPENDENT_AMBULATORY_CARE_PROVIDER_SITE_OTHER): Payer: Medicaid Other | Admitting: Podiatry

## 2021-09-20 DIAGNOSIS — M7672 Peroneal tendinitis, left leg: Secondary | ICD-10-CM

## 2021-09-27 NOTE — Progress Notes (Signed)
?  Subjective:  ?Patient ID: Derek Lee, male    DOB: 09/07/1975,  MRN: [id number] ? ?No chief complaint on file. ? ? ?46 y.o. male presents with the above complaint.  Patient states the PRP injection helped considerably.  He has healed on top of the foot.  It has healed on most of his foot.  He is now only having pain to the left lateral side.  He wanted get it evaluated discuss for PRP injections. ? ?Past Medical History:  ?Diagnosis Date  ? Asthma   ? Gout   ? Hyperlipidemia   ? Sleep apnea   ? noncompliant with cpap  ? ? ?Current Outpatient Medications:  ?  allopurinol (ZYLOPRIM) 100 MG tablet, Take 1 tablet (100 mg total) by mouth 2 (two) times daily., Disp: 60 tablet, Rfl: 3 ?  Colchicine (MITIGARE) 0.6 MG CAPS, Take 0.6 mg by mouth daily., Disp: 30 capsule, Rfl: 2 ?  Colchicine 0.6 MG CAPS, Take 0.6 mg by mouth 2 (two) times daily as needed., Disp: 60 capsule, Rfl: 3 ?  ibuprofen (ADVIL,MOTRIN) 800 MG tablet, Take 1 tablet (800 mg total) by mouth every 8 (eight) hours as needed (for knee pain)., Disp: 21 tablet, Rfl: 0 ?  metoprolol succinate (TOPROL-XL) 25 MG 24 hr tablet, Take 1 tablet by mouth daily., Disp: , Rfl:  ? ?Social History  ? ?Tobacco Use  ?Smoking Status Former  ? Types: Cigarettes  ? Quit date: 05/04/2014  ? Years since quitting: 7.4  ?Smokeless Tobacco Never  ? ? ?No Known Allergies ?Objective:  ?There were no vitals filed for this visit. ?There is no height or weight on file to calculate BMI. ?Constitutional Well developed. ?Well nourished.  ?Vascular Dorsalis pedis pulses palpable bilaterally. ?Posterior tibial pulses palpable bilaterally. ?Capillary refill normal to all digits.  ?No cyanosis or clubbing noted. ?Pedal hair growth normal.  ?Neurologic Normal speech. ?Oriented to person, place, and time. ?Epicritic sensation to light touch grossly present bilaterally.  ?Dermatologic Nails well groomed and normal in appearance. ?No open wounds. ?No skin lesions.  ?Orthopedic: No further  pain on palpation localized to the right lateral foot around the midshaft of the fourth metatarsal and left dorsal midfoot.  No further pain on palpation to the both of the sites.  No pain with extensor and flexor range of motion.  No pain with resisted dorsiflexion and plantarflexion of the digits. ? ?No further pain noted at the ATFL ligament.  Patient is now having some pain in the left lateral foot peroneal tendon.  Pain on palpation.  ? ?Radiographs: None ?Assessment:  ? ?1. Peroneal tendinitis, left   ? ? ? ?Plan:  ?Patient was evaluated and treated and all questions answered. ? ?Left lateral peroneal tendinitis ?-I believe patient will benefit from PRP injection in that area as well given that it is causing him a lot of pain. ? ?Bilateral ankle instability/capsulitis ?-I explained the patient the etiology of capsulitis and various treatment options were discussed.  I believe patient will benefit from PRP injection.  He will be scheduled in 2 weeks to undergo another PRP injection ? 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 ?No follow-ups on file.  ?

## 2021-10-02 ENCOUNTER — Ambulatory Visit (INDEPENDENT_AMBULATORY_CARE_PROVIDER_SITE_OTHER): Payer: Self-pay | Admitting: Podiatry

## 2021-10-02 DIAGNOSIS — M7672 Peroneal tendinitis, left leg: Secondary | ICD-10-CM

## 2021-10-02 NOTE — Progress Notes (Signed)
PRP was injected to the left lateral foot 2 sites.  5 cc 2-1/2 in each.  This was done in standard technique.  No complication noted. ?

## 2021-11-01 ENCOUNTER — Ambulatory Visit (INDEPENDENT_AMBULATORY_CARE_PROVIDER_SITE_OTHER): Payer: Medicaid Other | Admitting: Podiatry

## 2021-11-01 DIAGNOSIS — M7672 Peroneal tendinitis, left leg: Secondary | ICD-10-CM | POA: Diagnosis not present

## 2021-11-01 DIAGNOSIS — M7751 Other enthesopathy of right foot: Secondary | ICD-10-CM

## 2021-11-01 DIAGNOSIS — M7752 Other enthesopathy of left foot: Secondary | ICD-10-CM | POA: Diagnosis not present

## 2021-11-05 NOTE — Progress Notes (Signed)
  Subjective:  Patient ID: Derek Lee, male    DOB: Jun 23, 1975,  MRN: [id number]  Chief Complaint  Patient presents with   Foot Pain    46 y.o. male presents with the above complaint.  Patient states the PRP injection helped considerably.  He states he is walking a lot better he states his foot feels a lot better.  He states he is doing okay with that.  Injections for now.  I did discuss with him to come back and get them as needed.  Past Medical History:  Diagnosis Date   Asthma    Gout    Hyperlipidemia    Sleep apnea    noncompliant with cpap    Current Outpatient Medications:    allopurinol (ZYLOPRIM) 100 MG tablet, Take 1 tablet (100 mg total) by mouth 2 (two) times daily., Disp: 60 tablet, Rfl: 3   Colchicine (MITIGARE) 0.6 MG CAPS, Take 0.6 mg by mouth daily., Disp: 30 capsule, Rfl: 2   Colchicine 0.6 MG CAPS, Take 0.6 mg by mouth 2 (two) times daily as needed., Disp: 60 capsule, Rfl: 3   ibuprofen (ADVIL,MOTRIN) 800 MG tablet, Take 1 tablet (800 mg total) by mouth every 8 (eight) hours as needed (for knee pain)., Disp: 21 tablet, Rfl: 0   metoprolol succinate (TOPROL-XL) 25 MG 24 hr tablet, Take 1 tablet by mouth daily., Disp: , Rfl:   Social History   Tobacco Use  Smoking Status Former   Types: Cigarettes   Quit date: 05/04/2014   Years since quitting: 7.5  Smokeless Tobacco Never    No Known Allergies Objective:  There were no vitals filed for this visit. There is no height or weight on file to calculate BMI. Constitutional Well developed. Well nourished.  Vascular Dorsalis pedis pulses palpable bilaterally. Posterior tibial pulses palpable bilaterally. Capillary refill normal to all digits.  No cyanosis or clubbing noted. Pedal hair growth normal.  Neurologic Normal speech. Oriented to person, place, and time. Epicritic sensation to light touch grossly present bilaterally.  Dermatologic Nails well groomed and normal in appearance. No open  wounds. No skin lesions.  Orthopedic: No further pain on palpation localized to the right lateral foot around the midshaft of the fourth metatarsal and left dorsal midfoot.  No further pain on palpation to the both of the sites.  No pain with extensor and flexor range of motion.  No pain with resisted dorsiflexion and plantarflexion of the digits.  No further pain noted at the ATFL ligament.  No further pain at the peroneal tendon.  No further pain on palpation.   Radiographs: None Assessment:   No diagnosis found.    Plan:  Patient was evaluated and treated and all questions answered.  Left lateral peroneal tendinitis -Clinically resolved.  At this time I discussed with the patient that he will benefit as needed for PRP injection.  Patient states understanding.  Bilateral ankle instability/capsulitis -Clinically resolved with PRP injection.  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 No follow-ups on file.

## 2021-12-27 ENCOUNTER — Telehealth: Payer: Self-pay | Admitting: Podiatry

## 2021-12-27 NOTE — Telephone Encounter (Signed)
Pt called wanting to set up the prp procedure and I told him we would get back to him to set it up.

## 2022-03-18 ENCOUNTER — Telehealth: Payer: Self-pay | Admitting: Podiatry

## 2022-03-18 NOTE — Telephone Encounter (Signed)
Pt wanting to get a advice should he get an injection on R foot for pain. Pt has appt on 10/26 in [place].   Please advise

## 2022-03-20 ENCOUNTER — Ambulatory Visit: Payer: Medicaid Other | Admitting: Podiatry

## 2022-03-20 ENCOUNTER — Telehealth: Payer: Self-pay | Admitting: Podiatry

## 2022-03-20 DIAGNOSIS — M778 Other enthesopathies, not elsewhere classified: Secondary | ICD-10-CM

## 2022-03-20 DIAGNOSIS — M359 Systemic involvement of connective tissue, unspecified: Secondary | ICD-10-CM | POA: Diagnosis not present

## 2022-03-20 DIAGNOSIS — M7672 Peroneal tendinitis, left leg: Secondary | ICD-10-CM

## 2022-03-20 NOTE — Telephone Encounter (Signed)
Pt was checking on his prescription for pain.  Please advise

## 2022-03-21 MED ORDER — METHYLPREDNISOLONE 4 MG PO TBPK: ORAL_TABLET | ORAL | 0 refills | Status: DC

## 2022-03-27 DIAGNOSIS — M7672 Peroneal tendinitis, left leg: Secondary | ICD-10-CM | POA: Insufficient documentation

## 2022-03-27 LAB — ANA, IFA COMPREHENSIVE PANEL
Anti Nuclear Antibody (ANA): POSITIVE — AB
ENA SM Ab Ser-aCnc: <1 AI
SM/RNP: <1 AI
SSA (Ro) (ENA) Antibody, IgG: <1 AI
SSB (La) (ENA) Antibody, IgG: <1 AI
Scleroderma (Scl-70) (ENA) Antibody, IgG: <1 AI
ds DNA Ab: <1 IU/mL

## 2022-03-27 LAB — ANTI-NUCLEAR AB-TITER (ANA TITER): ANA Titer 1: 1:40 {titer} — ABNORMAL HIGH

## 2022-03-27 NOTE — Progress Notes (Signed)
Subjective:  Patient ID: Derek Lee, male    DOB: December 14, 1975,  MRN: [id number]  Chief Complaint  Patient presents with   Foot Pain    46 y.o. male presents with the above complaint.  Patient presents with follow-up of PRP injection.  Patient states physical therapy.  Injection is doing well.  The right side started to have a little bit more weakness and there is some pain on the dorsal lateral foot.  He would like to know if he can get another shot there.  He also would like to do a panel for autoimmune diseases he is expressing to me that he may have some knee and elbow pain along with ankle pain  Review of Systems: Negative except as noted in the HPI. Denies N/V/F/Ch.  Past Medical History:  Diagnosis Date   Asthma    Gout    Hyperlipidemia    Sleep apnea    noncompliant with cpap    Current Outpatient Medications:    allopurinol (ZYLOPRIM) 100 MG tablet, Take 1 tablet (100 mg total) by mouth 2 (two) times daily., Disp: 60 tablet, Rfl: 3   Colchicine (MITIGARE) 0.6 MG CAPS, Take 0.6 mg by mouth daily., Disp: 30 capsule, Rfl: 2   Colchicine 0.6 MG CAPS, Take 0.6 mg by mouth 2 (two) times daily as needed., Disp: 60 capsule, Rfl: 3   ibuprofen (ADVIL,MOTRIN) 800 MG tablet, Take 1 tablet (800 mg total) by mouth every 8 (eight) hours as needed (for knee pain)., Disp: 21 tablet, Rfl: 0   methylPREDNISolone (MEDROL DOSEPAK) 4 MG TBPK tablet, Take as directed, Disp: 21 each, Rfl: 0   metoprolol succinate (TOPROL-XL) 25 MG 24 hr tablet, Take 1 tablet by mouth daily., Disp: , Rfl:   Social History   Tobacco Use  Smoking Status Former   Types: Cigarettes   Quit date: 05/04/2014   Years since quitting: 7.9  Smokeless Tobacco Never    No Known Allergies Objective:  There were no vitals filed for this visit. There is no height or weight on file to calculate BMI. Constitutional Well developed. Well nourished.  Vascular Dorsalis pedis pulses palpable bilaterally. Posterior  tibial pulses palpable bilaterally. Capillary refill normal to all digits.  No cyanosis or clubbing noted. Pedal hair growth normal.  Neurologic Normal speech. Oriented to person, place, and time. Epicritic sensation to light touch grossly present bilaterally.  Dermatologic Nails well groomed and normal in appearance. No open wounds. No skin lesions.  Orthopedic:  Mpain on palpation localized to the right lateral foot around the midshaft of the fourth metatarsal Mild pain on palpation to the right side of the sites.  No pain with extensor and flexor range of motion.  No pain with resisted dorsiflexion and plantarflexion of the digits.   Radiographs: None Assessment:   1. Capsulitis of right foot     Plan:  Patient was evaluated and treated and all questions answered.  Right lateral midfoot arthritis - I discussed with him that given that his pain is doing well we will hold off on steroid injection at this time.  He does have generalized weakness to both lower extremity while ambulating.  Continue physical therapy for general strengthening of the foot which seems to be helping. -Therapy injection has also been helping as well. -Given that his pain is regressing having some pain on the right foot he will benefit from a steroid injection to the right foot. -A steroid injection was performed at right foot at point of  maximal tenderness using 1% plain Lidocaine and 10 mg of Kenalog. This was well tolerated. -I also ordered blood work for autoimmune disease as well given that patient has multiple joint problems as he expressed to me.  He states that his elbows and knees are also causing some issues.   No follow-ups on file.

## 2022-03-28 NOTE — Addendum Note (Signed)
Addended by: Boneta Lucks on: 03/28/2022 08:24 AM   Modules accepted: Orders

## 2022-04-01 ENCOUNTER — Telehealth: Payer: Self-pay | Admitting: Podiatry

## 2022-04-01 LAB — C-REACTIVE PROTEIN: CRP: 1 mg/L (ref 0–10)

## 2022-04-01 LAB — CBC WITH DIFFERENTIAL/PLATELET
Basophils Absolute: 0 10*3/uL (ref 0.0–0.2)
Basos: 0 %
EOS (ABSOLUTE): 0.2 10*3/uL (ref 0.0–0.4)
Eos: 3 %
Hematocrit: 43.1 % (ref 37.5–51.0)
Hemoglobin: 14.8 g/dL (ref 13.0–17.7)
Immature Grans (Abs): 0 10*3/uL (ref 0.0–0.1)
Immature Granulocytes: 0 %
Lymphocytes Absolute: 1.6 10*3/uL (ref 0.7–3.1)
Lymphs: 29 %
MCH: 31.3 pg (ref 26.6–33.0)
MCHC: 34.3 g/dL (ref 31.5–35.7)
MCV: 91 fL (ref 79–97)
Monocytes Absolute: 0.5 10*3/uL (ref 0.1–0.9)
Monocytes: 9 %
Neutrophils Absolute: 3.3 10*3/uL (ref 1.4–7.0)
Neutrophils: 59 %
Platelets: 213 10*3/uL (ref 150–450)
RBC: 4.73 x10E6/uL (ref 4.14–5.80)
RDW: 11.2 % — ABNORMAL LOW (ref 11.6–15.4)
WBC: 5.6 10*3/uL (ref 3.4–10.8)

## 2022-04-01 LAB — COMPREHENSIVE METABOLIC PANEL
ALT: 12 IU/L (ref 0–44)
AST: 16 IU/L (ref 0–40)
Albumin/Globulin Ratio: 1.6 (ref 1.2–2.2)
Albumin: 4.2 g/dL (ref 4.1–5.1)
Alkaline Phosphatase: 57 IU/L (ref 44–121)
BUN/Creatinine Ratio: 6 — ABNORMAL LOW (ref 9–20)
BUN: 7 mg/dL (ref 6–24)
Bilirubin Total: 0.5 mg/dL (ref 0.0–1.2)
CO2: 24 mmol/L (ref 20–29)
Calcium: 9.2 mg/dL (ref 8.7–10.2)
Chloride: 100 mmol/L (ref 96–106)
Creatinine, Ser: 1.1 mg/dL (ref 0.76–1.27)
Globulin, Total: 2.7 g/dL (ref 1.5–4.5)
Glucose: 95 mg/dL (ref 70–99)
Potassium: 3.6 mmol/L (ref 3.5–5.2)
Sodium: 141 mmol/L (ref 134–144)
Total Protein: 6.9 g/dL (ref 6.0–8.5)
eGFR: 84 mL/min/{1.73_m2} (ref >59)

## 2022-04-01 LAB — URIC ACID: Uric Acid: 6.7 mg/dL (ref 3.8–8.4)

## 2022-04-01 LAB — HLA-B27 ANTIGEN: HLA B27: NEGATIVE

## 2022-04-01 LAB — RHEUMATOID ARTHRITIS PROFILE
Cyclic Citrullin Peptide Ab: <1 units (ref 0–19)
Rheumatoid fact SerPl-aCnc: <10 IU/mL (ref <14.0)

## 2022-04-01 LAB — SEDIMENTATION RATE: Sed Rate: 22 mm/hr — ABNORMAL HIGH (ref 0–15)

## 2022-04-01 NOTE — Telephone Encounter (Signed)
Pt called asking to get scheduled for the prp treatment.  Per Joycelyn Schmid it has to be a Wednesday on nurse schedule when Dr Posey Pronto is in the office as well.  I called pt back and he is scheduled for 11.15.2023 @ 1100 for the prp treatment on nurse schedule and Dr Posey Pronto is in the office on this day.

## 2022-04-02 NOTE — Telephone Encounter (Signed)
Noted  

## 2022-04-09 ENCOUNTER — Other Ambulatory Visit: Payer: Medicaid Other

## 2022-04-23 ENCOUNTER — Other Ambulatory Visit: Payer: Medicaid Other

## 2022-04-25 ENCOUNTER — Other Ambulatory Visit: Payer: Medicaid Other

## 2022-04-30 ENCOUNTER — Ambulatory Visit (INDEPENDENT_AMBULATORY_CARE_PROVIDER_SITE_OTHER): Payer: Medicaid Other | Admitting: Podiatry

## 2022-04-30 DIAGNOSIS — M778 Other enthesopathies, not elsewhere classified: Secondary | ICD-10-CM

## 2022-05-13 NOTE — Progress Notes (Signed)
PRP injection was given in standard technique.  4 cc was injected in the affected area of the right foot.  No complications noted

## 2022-07-09 NOTE — Progress Notes (Unsigned)
Office Visit Note  Patient: Derek Lee             Date of Birth: 02/03/1976           MRN: [id number]             PCP: Patient, No Pcp Per Referring: Felipa Furnace, DPM Visit Date: 07/10/2022 Occupation: @GUAROCC$ @  Subjective:  No chief complaint on file.   History of Present Illness: LEMAR LYGA is a 47 y.o. 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   Patient {ACTIONS;DENIES/REPORTS:21021675::"Denies"} nocturnal pain.  Difficulty dressing/grooming: {ACTIONS;DENIES/REPORTS:21021675::"Denies"} Difficulty climbing stairs: {ACTIONS;DENIES/REPORTS:21021675::"Denies"} Difficulty getting out of chair: {ACTIONS;DENIES/REPORTS:21021675::"Denies"} Difficulty using hands for taps, buttons, cutlery, and/or writing: {ACTIONS;DENIES/REPORTS:21021675::"Denies"}  No Rheumatology ROS completed.   PMFS History:  Patient Active Problem List   Diagnosis Date Noted   Peroneal tendinitis, left 03/27/2022   Fracture of humeral shaft, right, closed 02/16/2017   Hyperlipidemia     Past Medical History:  Diagnosis Date   Asthma    Gout    Hyperlipidemia    Sleep apnea    noncompliant with cpap    Family History  Problem Relation Age of Onset   Diabetes Mother    High blood pressure Father    Past Surgical History:  Procedure Laterality Date   ORIF HUMERUS FRACTURE Right 02/16/2017   Procedure: OPEN REDUCTION INTERNAL FIXATION (ORIF) RIGHT HUMERAL SHAFT FRACTURE;  Surgeon: Hiram Gash, MD;  Location: [facility];  Service: Orthopedics;  Laterality: Right;   Social History   Social History Narrative   Right handed    There is no immunization history on file for this patient.   Objective: Vital Signs: There were no vitals taken for this visit.   Physical Exam   Musculoskeletal Exam: ***  CDAI Exam: CDAI Score: -- Patient Global: --; Provider Global: -- Swollen: --; Tender: -- Joint Exam 07/10/2022   No joint exam has been documented for this visit   There is currently no information documented on the homunculus. Go to the Rheumatology activity and complete the homunculus joint exam.  Investigation: No additional findings.  Imaging: No results found.  Recent Labs: Lab Results  Component Value Date   WBC 5.6 03/25/2022   HGB 14.8 03/25/2022   PLT 213 03/25/2022   NA 141 03/25/2022   K 3.6 03/25/2022   CL 100 03/25/2022   CO2 24 03/25/2022   GLUCOSE 95 03/25/2022   BUN 7 03/25/2022   CREATININE 1.10 03/25/2022   BILITOT 0.5 03/25/2022   ALKPHOS 57 03/25/2022   AST 16 03/25/2022   ALT 12 03/25/2022   PROT 6.9 03/25/2022   ALBUMIN 4.2 03/25/2022   CALCIUM 9.2 03/25/2022   GFRAA >60 05/05/2015    Speciality Comments: No specialty comments available.  Procedures:  No procedures performed Allergies: Patient has no known allergies.   Assessment / Plan:     Visit Diagnoses: No diagnosis found.  Orders: No orders of the defined types were placed in this encounter.  No orders of the defined  types were placed in this encounter.   Face-to-face time spent with patient was *** minutes. Greater than 50% of time was spent in counseling and coordination of care.  Follow-Up Instructions: No follow-ups on file.   Collier Salina, MD  Note - This record has been created using [employer].  Chart creation errors have been sought, but may not always  have been located. Such creation errors do not reflect on  the standard of medical care.

## 2022-07-10 ENCOUNTER — Ambulatory Visit (INDEPENDENT_AMBULATORY_CARE_PROVIDER_SITE_OTHER): Payer: Medicaid Other

## 2022-07-10 ENCOUNTER — Encounter: Payer: Self-pay | Admitting: Internal Medicine

## 2022-07-10 ENCOUNTER — Ambulatory Visit: Payer: Medicaid Other | Attending: Internal Medicine | Admitting: Internal Medicine

## 2022-07-10 ENCOUNTER — Ambulatory Visit: Payer: Medicaid Other

## 2022-07-10 VITALS — BP 121/70 | HR 93 | Resp 14 | Ht 67.0 in | Wt 210.0 lb

## 2022-07-10 DIAGNOSIS — M25521 Pain in right elbow: Secondary | ICD-10-CM | POA: Diagnosis not present

## 2022-07-10 DIAGNOSIS — M25562 Pain in left knee: Secondary | ICD-10-CM | POA: Diagnosis not present

## 2022-07-10 DIAGNOSIS — M1990 Unspecified osteoarthritis, unspecified site: Secondary | ICD-10-CM

## 2022-07-10 DIAGNOSIS — G8929 Other chronic pain: Secondary | ICD-10-CM | POA: Diagnosis not present

## 2022-07-10 DIAGNOSIS — M25561 Pain in right knee: Secondary | ICD-10-CM

## 2022-07-10 DIAGNOSIS — M7672 Peroneal tendinitis, left leg: Secondary | ICD-10-CM | POA: Diagnosis not present

## 2022-07-10 DIAGNOSIS — M1A00X0 Idiopathic chronic gout, unspecified site, without tophus (tophi): Secondary | ICD-10-CM | POA: Insufficient documentation

## 2022-07-10 DIAGNOSIS — M1A09X0 Idiopathic chronic gout, multiple sites, without tophus (tophi): Secondary | ICD-10-CM | POA: Diagnosis not present

## 2022-07-10 DIAGNOSIS — M25522 Pain in left elbow: Secondary | ICD-10-CM | POA: Diagnosis not present

## 2022-07-10 DIAGNOSIS — M25512 Pain in left shoulder: Secondary | ICD-10-CM | POA: Diagnosis not present

## 2022-07-10 DIAGNOSIS — R768 Other specified abnormal immunological findings in serum: Secondary | ICD-10-CM

## 2022-07-11 LAB — URIC ACID: Uric Acid, Serum: 7.6 mg/dL (ref 4.0–8.0)

## 2022-07-11 LAB — RPR: RPR Ser Ql: NONREACTIVE

## 2022-07-11 LAB — TISSUE TRANSGLUTAMINASE, IGA: (tTG) Ab, IgA: <1 U/mL

## 2022-07-11 LAB — C. TRACHOMATIS/N. GONORRHOEAE RNA
C. trachomatis RNA, TMA: NOT DETECTED
N. gonorrhoeae RNA, TMA: NOT DETECTED

## 2022-07-11 LAB — SEDIMENTATION RATE: Sed Rate: 2 mm/h (ref 0–15)

## 2022-07-14 MED ORDER — ALLOPURINOL 300 MG PO TABS: 300.0000 mg | ORAL_TABLET | Freq: Every day | ORAL | 1 refills | Status: DC

## 2022-07-14 NOTE — Addendum Note (Signed)
Addended by: Collier Salina on: 07/14/2022 09:26 PM   Modules accepted: Orders

## 2022-07-14 NOTE — Progress Notes (Signed)
Sedimentation rate is 2 down from 22 3 months ago. His uric acid remains above goal at 7.6. Until getting this consistently below 6 I cannot say gout would not be contributing to ongoing symptoms. I recommend increasing allopurinol to 300 mg daily. We can switch Rx to the 300 mg tablets going forward. I recommend follow up to recheck about 1 month after increasing this dose.

## 2022-07-15 ENCOUNTER — Other Ambulatory Visit: Payer: Self-pay | Admitting: *Deleted

## 2022-08-07 NOTE — Progress Notes (Signed)
Office Visit Note  Patient: Derek Lee.             Date of Birth: Mar 07, 1976           MRN: [id number]             PCP: Patient, No Pcp Per Referring: No ref. provider found Visit Date: 08/08/2022   Subjective:  Follow-up (Patient states he would like to check his uric acid levels again. )   History of Present Illness: Derek Lee. is a 47 y.o. 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 Lab testing at initial visit showed uric acid of 7.6 so allopurinol was titrated to 300 mg daily. He had another flare up starting in the right elbow with warmth, swelling, and pain and started taking ibuprofen 800 mg for this then colchicine 0.6 mg 2 tablets daily and symptoms improved. Started to have some symptoms in the left elbow but without developing a full flare or swelling. Symptoms stayed improved with colchicine daily for the following week.  Previous HPI 07/10/22 Derek Lee is a 47 y.o. male here for evaluation of positive ANA checked associated with recurrent joint pain in multiple areas particularly affecting him in bilateral feet knees elbows and left shoulder pain.  He has history of gout since more than 5 years ago episodes of pain and swelling that have been present in either foot but is currently on maintenance with 200 mg allopurinol and feels like these types of flares are now infrequent only once or twice per year.  During about the 3 to 4 years he started having different joint pain with inflammation starting at 1 spot that would travel around for about 2 weeks total at a time.  This did not improve with colchicine as he had typically seen with gout flares.  He takes ibuprofen during these or gets steroid injection or oral taper which are beneficial.  He notices warmth pain and stiffness in the joints without much visible swelling.  During these episodes his foot or knee pain are so bad he cannot tolerate  walking any significant distance and has to use a wheelchair or just does not move around at all for prolonged times.  More recently he is having joint soreness even outside of these flareups such that he cannot stand on his feet for more than maybe 1 hour at most and walking distance is limited.  Previous imaging demonstrated capsulitis in the foot as well as some midfoot degenerative changes and questionable peroneal tendinopathy.   Labs reviewed 02/2022 ANA 1:40 DFS dsDNA, Scl-70, Sm, SSA, SSB neg RF neg CCP neg ESR 22 CRP 1 Uric acid 6.7 HLA-B27 neg   Review of Systems  Constitutional:  Negative for fatigue.  HENT:  Negative for mouth sores and mouth dryness.   Eyes:  Negative for dryness.  Respiratory:  Negative for shortness of breath.   Cardiovascular:  Negative for chest pain and palpitations.  Gastrointestinal:  Negative for blood in stool, constipation and diarrhea.  Endocrine: Negative for increased urination.  Genitourinary:  Negative for involuntary urination.  Musculoskeletal:  Positive for gait problem, muscle weakness, morning stiffness and muscle tenderness. Negative for joint pain, joint pain, joint swelling, myalgias and myalgias.  Skin:  Negative for color change, rash, hair loss and sensitivity to sunlight.  Allergic/Immunologic: Negative for susceptible to infections.  Neurological:  Negative for dizziness and headaches.  Hematological:  Negative for  swollen glands.  Psychiatric/Behavioral:  Negative for depressed mood and sleep disturbance. The patient is not nervous/anxious.     PMFS History:  Patient Active Problem List   Diagnosis Date Noted   Inflammatory arthritis 08/08/2022   Idiopathic chronic gout, unspecified site, without tophus (tophi) 07/10/2022   Peroneal tendinitis, left 03/27/2022   Fracture of humeral shaft, right, closed 02/16/2017   Hyperlipidemia     Past Medical History:  Diagnosis Date   Asthma    Gout    Hyperlipidemia    Sleep  apnea    noncompliant with cpap    Family History  Problem Relation Age of Onset   Diabetes Mother    High blood pressure Father    Past Surgical History:  Procedure Laterality Date   ORIF HUMERUS FRACTURE Right 02/16/2017   Procedure: OPEN REDUCTION INTERNAL FIXATION (ORIF) RIGHT HUMERAL SHAFT FRACTURE;  Surgeon: Hiram Gash, MD;  Location: [facility];  Service: Orthopedics;  Laterality: Right;   Social History   Social History Narrative   Right handed    There is no immunization history on file for this patient.   Objective: Vital Signs: BP 114/71 (BP Location: Left Arm, Patient Position: Sitting, Cuff Size: Normal)   Pulse 90   Resp 14   Ht 5\' 7"  (1.702 m)   Wt 210 lb (95.3 kg) Comment: Patient in wheelchair  BMI 32.89 kg/m    Physical Exam Constitutional:      Appearance: He is obese.     Comments: In wheelchair  Cardiovascular:     Rate and Rhythm: Normal rate and regular rhythm.  Pulmonary:     Effort: Pulmonary effort is normal.     Breath sounds: Normal breath sounds.  Lymphadenopathy:     Cervical: No cervical adenopathy.  Skin:    General: Skin is warm and dry.     Findings: Rash present.     Comments: Slightly raised erythematous rash along front hairline and behind ears  Neurological:     Mental Status: He is alert.  Psychiatric:        Mood and Affect: Mood normal.      Musculoskeletal Exam:  Shoulders full ROM no tenderness or swelling Elbows full ROM no tenderness or swelling Wrists full ROM no tenderness or swelling Fingers full ROM no tenderness or swelling Knees full ROM no tenderness or swelling Ankles full ROM no tenderness or swelling   Investigation: No additional findings.  Imaging: XR Elbow 2 Views Left  Result Date: 07/10/2022 X-ray left elbow 2 views Radiohumeral and ulnohumeral joint spaces appear normal.  No bone spurring or abnormality of olecranon process.  No erosions or abnormal calcifications seen.  Impression Normal elbow x-ray  XR Elbow 2 Views Right  Result Date: 07/10/2022 X-ray right elbow 2 views Surgical hardware in the humerus present and appears in a normal position.  Radiohumeral and ulnohumeral joint spaces appear normal.  Probable very early olecranon enthesophyte.  No abnormal calcifications or erosions seen. Impression No significant osteoarthritis or erosive changes  XR KNEE 3 VIEW LEFT  Result Date: 07/10/2022 X-ray left knee 3 views Medial lateral compartment joint space appears normal.  No chondrocalcinosis seen.  Patellofemoral joint appears normal.  There is a posterior ossicle or calcified body present. Impression No significant osteoarthritis or inflammatory changes  XR KNEE 3 VIEW RIGHT  Result Date: 07/10/2022 X-ray right knee 3 views Medial and lateral compartment joint spaces appear preserved.  No chondrocalcinosis seen.  Patellofemoral joint space  appears normal.  Possible very early superior enthesophyte process.  There is a posterior calcified body present. Impression No significant osteoarthritis, posterior ossicle or free body present  XR Shoulder Left  Result Date: 07/10/2022 X-ray left shoulder 4 views Glenohumeral joint appears normal.  Acromiohumeral distance is normal.  AC joint with is normal there may be small marginal osteophyte process on the acromion side.  Normal internal and external rotated position no obvious calcifications or joint effusion seen. Impression No significant appearing arthritis changes   Recent Labs: Lab Results  Component Value Date   WBC 5.6 03/25/2022   HGB 14.8 03/25/2022   PLT 213 03/25/2022   NA 141 03/25/2022   K 3.6 03/25/2022   CL 100 03/25/2022   CO2 24 03/25/2022   GLUCOSE 95 03/25/2022   BUN 7 03/25/2022   CREATININE 1.10 03/25/2022   BILITOT 0.5 03/25/2022   ALKPHOS 57 03/25/2022   AST 16 03/25/2022   ALT 12 03/25/2022   PROT 6.9 03/25/2022   ALBUMIN 4.2 03/25/2022   CALCIUM 9.2 03/25/2022   GFRAA >60  05/05/2015    Speciality Comments: No specialty comments available.  Procedures:  No procedures performed Allergies: Patient has no known allergies.   Assessment / Plan:     Visit Diagnoses: Inflammatory arthritis  Ongoing episodes involving what sounds like olecranon bursitis.  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  Most likely just incompletely controlled gout though still cannot entirely rule out other inflammation causes.  He is curious about options for treating his chronic foot and ankle pain though severely limiting mobility, might benefit getting another opinion with podiatry or orthopedic surgery follow-up but need to control disease activity first.  Idiopathic chronic gout of multiple sites without tophus - Plan: Uric acid  Uric acid remains mildly above goal at 7.6 rechecking uric acid level after allopurinol titration to 300 mg daily.  Recommending continuing colchicine at 0.6 mg once daily prophylaxis for flares until at goal for 3 months.  Orders: Orders Placed This Encounter  Procedures   Uric acid   No orders of the defined types were placed in this encounter.    Follow-Up Instructions: No follow-ups on file.   Collier Salina, MD  Note - This record has been created using [employer].  Chart creation errors have been sought, but may not always  have been located. Such creation errors do not reflect on  the standard of medical care.

## 2022-08-08 ENCOUNTER — Encounter: Payer: Self-pay | Admitting: Internal Medicine

## 2022-08-08 ENCOUNTER — Ambulatory Visit: Payer: Medicaid Other | Attending: Internal Medicine | Admitting: Internal Medicine

## 2022-08-08 VITALS — BP 114/71 | HR 90 | Resp 14 | Ht 67.0 in | Wt 210.0 lb

## 2022-08-08 DIAGNOSIS — M1A09X0 Idiopathic chronic gout, multiple sites, without tophus (tophi): Secondary | ICD-10-CM

## 2022-08-08 DIAGNOSIS — M1990 Unspecified osteoarthritis, unspecified site: Secondary | ICD-10-CM

## 2022-08-08 MED ORDER — COLCHICINE 0.6 MG PO CAPS: 0.6000 mg | ORAL_CAPSULE | Freq: Every day | ORAL | 2 refills | Status: DC

## 2022-08-09 LAB — URIC ACID: Uric Acid, Serum: 7.7 mg/dL (ref 4.0–8.0)

## 2022-08-12 ENCOUNTER — Other Ambulatory Visit: Payer: Self-pay | Admitting: *Deleted

## 2022-08-12 NOTE — Telephone Encounter (Signed)
Patient's insurance prefers Mitigare Capsules Brand name. Please review new prescription and send to the pharmacy.

## 2022-08-13 ENCOUNTER — Other Ambulatory Visit: Payer: Self-pay | Admitting: *Deleted

## 2022-08-13 DIAGNOSIS — M1A09X0 Idiopathic chronic gout, multiple sites, without tophus (tophi): Secondary | ICD-10-CM

## 2022-08-13 MED ORDER — ALLOPURINOL 300 MG PO TABS: 450.0000 mg | ORAL_TABLET | Freq: Every day | ORAL | 2 refills | Status: DC

## 2022-08-13 MED ORDER — MITIGARE 0.6 MG PO CAPS: 0.6000 mg | ORAL_CAPSULE | Freq: Every day | ORAL | 2 refills | Status: DC

## 2022-08-13 NOTE — Addendum Note (Signed)
Addended by: Collier Salina on: 08/13/2022 08:16 AM   Modules accepted: Orders

## 2022-08-13 NOTE — Progress Notes (Signed)
Uric acid is 7.7 this is still above goal of 6.0. I recommend further increasing the allopurinol dose to 450 mg (1.5 tablets). Because we are changing the medication dose we may want to recheck the uric acid level again after about 1 month, if feeling well can just do lab only visit since we have clinic follow up in 3 months.

## 2022-08-15 MED ORDER — COLCHICINE 0.6 MG PO TABS: 0.6000 mg | ORAL_TABLET | Freq: Every day | ORAL | 2 refills | Status: DC

## 2022-08-15 NOTE — Addendum Note (Signed)
Addended by: Carole Binning on: 08/15/2022 12:49 PM   Modules accepted: Orders

## 2022-08-15 NOTE — Telephone Encounter (Signed)
Patient called the office and states he spoke with his insurance and states he was advised from them that they will cover the Colchicine tablets without a prior authorization. I explained to the patient that this is not what is listed as the preferred medication on the drug formulary. Patient is insistent that this would not need a prior authorization. I advised that I would have Dr. Benjamine Mola send it to the pharmacy. Advised we would not know if it would need a prior authorization until it was sent to the pharmacy. Patient expressed understanding.

## 2022-09-04 ENCOUNTER — Other Ambulatory Visit: Payer: Self-pay | Admitting: *Deleted

## 2022-09-04 DIAGNOSIS — M1A09X0 Idiopathic chronic gout, multiple sites, without tophus (tophi): Secondary | ICD-10-CM

## 2022-09-04 LAB — URIC ACID: Uric Acid, Serum: 6.4 mg/dL (ref 4.0–8.0)

## 2022-09-10 ENCOUNTER — Other Ambulatory Visit: Payer: Medicaid Other

## 2022-09-21 ENCOUNTER — Other Ambulatory Visit: Payer: Self-pay | Admitting: Internal Medicine

## 2022-09-21 DIAGNOSIS — M1A09X0 Idiopathic chronic gout, multiple sites, without tophus (tophi): Secondary | ICD-10-CM

## 2022-09-22 ENCOUNTER — Telehealth: Payer: Self-pay

## 2022-09-22 NOTE — Telephone Encounter (Signed)
Patient contacted the office regarding his Allopurinol. Advise patient the original prescription that was sent in on 08/13/2022 was sent in with two refills. Advise the patient there should be two refills left. Patient states he will check on the refills.

## 2022-11-13 ENCOUNTER — Other Ambulatory Visit: Payer: Self-pay | Admitting: *Deleted

## 2022-11-13 DIAGNOSIS — M1A09X0 Idiopathic chronic gout, multiple sites, without tophus (tophi): Secondary | ICD-10-CM

## 2022-11-13 LAB — CBC WITH DIFFERENTIAL/PLATELET
Absolute Monocytes: 410 cells/uL (ref 200–950)
Basophils Absolute: 23 cells/uL (ref 0–200)
Basophils Relative: 0.3 %
Eosinophils Absolute: 182 cells/uL (ref 15–500)
Eosinophils Relative: 2.4 %
HCT: 43.2 % (ref 38.5–50.0)
Hemoglobin: 14.8 g/dL (ref 13.2–17.1)
Lymphs Abs: 1976 cells/uL (ref 850–3900)
MCH: 31.9 pg (ref 27.0–33.0)
MCHC: 34.3 g/dL (ref 32.0–36.0)
MCV: 93.1 fL (ref 80.0–100.0)
MPV: 12.9 fL — ABNORMAL HIGH (ref 7.5–12.5)
Monocytes Relative: 5.4 %
Neutro Abs: 5008 cells/uL (ref 1500–7800)
Neutrophils Relative %: 65.9 %
Platelets: 237 10*3/uL (ref 140–400)
RBC: 4.64 10*6/uL (ref 4.20–5.80)
RDW: 12.2 % (ref 11.0–15.0)
Total Lymphocyte: 26 %
WBC: 7.6 10*3/uL (ref 3.8–10.8)

## 2022-11-13 LAB — COMPLETE METABOLIC PANEL WITH GFR
AG Ratio: 1.8 (calc) (ref 1.0–2.5)
ALT: 12 U/L (ref 9–46)
AST: 15 U/L (ref 10–40)
Albumin: 4.6 g/dL (ref 3.6–5.1)
Alkaline phosphatase (APISO): 59 U/L (ref 36–130)
BUN: 9 mg/dL (ref 7–25)
CO2: 29 mmol/L (ref 20–32)
Calcium: 9.4 mg/dL (ref 8.6–10.3)
Chloride: 101 mmol/L (ref 98–110)
Creat: 1.03 mg/dL (ref 0.60–1.29)
Globulin: 2.6 g/dL (calc) (ref 1.9–3.7)
Glucose, Bld: 89 mg/dL (ref 65–99)
Potassium: 4.2 mmol/L (ref 3.5–5.3)
Sodium: 138 mmol/L (ref 135–146)
Total Bilirubin: 0.8 mg/dL (ref 0.2–1.2)
Total Protein: 7.2 g/dL (ref 6.1–8.1)
eGFR: 91 mL/min/{1.73_m2} (ref >=60)

## 2022-11-13 LAB — URIC ACID: Uric Acid, Serum: 4.2 mg/dL (ref 4.0–8.0)

## 2022-11-14 NOTE — Progress Notes (Deleted)
Office Visit Note  Patient: Derek Lee.             Date of Birth: December 10, 1975           MRN: [id number]             PCP: Patient, No Pcp Per Referring: No ref. provider found Visit Date: 11/17/2022   Subjective:  No chief complaint on file.   History of Present Illness: Derek Lee. is a 47 y.o. 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    Previous HPI 08/08/2022 Derek Pro. is a 47 y.o. 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 Lab testing at initial visit showed uric acid of 7.6 so allopurinol was titrated to 300 mg daily. He had another flare up starting in the right elbow with warmth, swelling, and pain and started taking ibuprofen 800 mg for this then colchicine 0.6 mg 2 tablets daily and symptoms improved. Started to have some symptoms in the left elbow but without developing a full flare or swelling. Symptoms stayed improved with colchicine daily for the following week.   Previous HPI 07/10/22 Derek Lee is a 47 y.o. male here for evaluation of positive ANA checked associated with recurrent joint pain in multiple areas particularly affecting him in bilateral feet knees elbows and left shoulder pain.  He has history of gout since more than 5 years ago episodes of pain and swelling that have been present in either foot but is currently on maintenance with 200 mg allopurinol and feels like these types of flares are now infrequent only once or twice per year.  During about the 3 to 4 years he started having different joint pain with inflammation starting at 1 spot that would travel around for about 2 weeks total at a time.  This did not improve with colchicine as he had typically seen with gout flares.  He takes ibuprofen during these or gets steroid injection or oral taper which are beneficial.  He notices warmth pain and stiffness  in the joints without much visible swelling.  During these episodes his foot or knee pain are so bad he cannot tolerate walking any significant distance and has to use a wheelchair or just does not move around at all for prolonged times.  More recently he is having joint soreness even outside of these flareups such that he cannot stand on his feet for more than maybe 1 hour at most and walking distance is limited.  Previous imaging demonstrated capsulitis in the foot as well as some midfoot degenerative changes and questionable peroneal tendinopathy.   Labs reviewed 02/2022 ANA 1:40 DFS dsDNA, Scl-70, Sm, SSA, SSB neg RF neg CCP neg ESR 22 CRP 1 Uric acid 6.7 HLA-B27 neg   No Rheumatology ROS completed.   PMFS History:  Patient Active Problem List   Diagnosis Date Noted   Inflammatory arthritis 08/08/2022   Idiopathic chronic gout, unspecified site, without tophus (tophi) 07/10/2022   Peroneal tendinitis, left 03/27/2022   Fracture of humeral shaft, right, closed 02/16/2017   Hyperlipidemia     Past Medical History:  Diagnosis Date   Asthma    Gout    Hyperlipidemia    Sleep apnea    noncompliant with cpap    Family History  Problem Relation Age of Onset   Diabetes Mother    High blood pressure  Father    Past Surgical History:  Procedure Laterality Date   ORIF HUMERUS FRACTURE Right 02/16/2017   Procedure: OPEN REDUCTION INTERNAL FIXATION (ORIF) RIGHT HUMERAL SHAFT FRACTURE;  Surgeon: [name], MD;  Location: [facility];  Service: Orthopedics;  Laterality: Right;   Social History   Social History Narrative   Right handed    There is no immunization history on file for this patient.   Objective: Vital Signs: There were no vitals taken for this visit.   Physical Exam   Musculoskeletal Exam: ***  CDAI Exam: CDAI Score: -- Patient Global: --; Provider Global: -- Swollen: --; Tender: -- Joint Exam 11/17/2022   No joint exam has been  documented for this visit   There is currently no information documented on the homunculus. Go to the Rheumatology activity and complete the homunculus joint exam.  Investigation: No additional findings.  Imaging: No results found.  Recent Labs: Lab Results  Component Value Date   WBC 7.6 11/13/2022   HGB 14.8 11/13/2022   PLT 237 11/13/2022   NA 138 11/13/2022   K 4.2 11/13/2022   CL 101 11/13/2022   CO2 29 11/13/2022   GLUCOSE 89 11/13/2022   BUN 9 11/13/2022   CREATININE 1.03 11/13/2022   BILITOT 0.8 11/13/2022   ALKPHOS 57 03/25/2022   AST 15 11/13/2022   ALT 12 11/13/2022   PROT 7.2 11/13/2022   ALBUMIN 4.2 03/25/2022   CALCIUM 9.4 11/13/2022   GFRAA >60 05/05/2015    Speciality Comments: No specialty comments available.  Procedures:  No procedures performed Allergies: Patient has no known allergies.   Assessment / Plan:     Visit Diagnoses: No diagnosis found.  ***  Orders: No orders of the defined types were placed in this encounter.  No orders of the defined types were placed in this encounter.    Follow-Up Instructions: No follow-ups on file.   Ellen Henri, CMA  Note - This record has been created using [employer].  Chart creation errors have been sought, but may not always  have been located. Such creation errors do not reflect on  the standard of medical care.

## 2022-11-17 ENCOUNTER — Ambulatory Visit: Payer: Medicaid Other | Admitting: Internal Medicine

## 2022-11-17 DIAGNOSIS — M1990 Unspecified osteoarthritis, unspecified site: Secondary | ICD-10-CM

## 2022-11-17 DIAGNOSIS — M1A09X0 Idiopathic chronic gout, multiple sites, without tophus (tophi): Secondary | ICD-10-CM

## 2022-12-01 ENCOUNTER — Other Ambulatory Visit: Payer: Self-pay

## 2022-12-07 ENCOUNTER — Other Ambulatory Visit: Payer: Self-pay | Admitting: Internal Medicine

## 2022-12-07 DIAGNOSIS — M1A09X0 Idiopathic chronic gout, multiple sites, without tophus (tophi): Secondary | ICD-10-CM

## 2022-12-08 NOTE — Telephone Encounter (Signed)
Last Fill: 08/13/2022  Labs: 11/13/2022 MPV 12.9, uric acid 4.2  Next Visit: Message sent to front desk to schedule appt. Return in about 3 months (around 11/08/2022) for Arthritis ?Gout vs CPPD/PsA on allopurinol and colchicine f/u 3mos.   Last Visit: 08/08/2022   DX: Idiopathic chronic gout of multiple sites without tophus   Current Dose per office note 08/08/2022: allopurinol dose to 450 mg (1.5 tablets)   Okay to refill Allopurinol?

## 2022-12-08 NOTE — Telephone Encounter (Signed)
Please call patient to schedule appt. Thank you.   Return in about 3 months (around 11/08/2022) for Arthritis ?Gout vs CPPD/PsA on allopurinol and colchicine f/u 3mos.

## 2022-12-09 NOTE — Telephone Encounter (Signed)
LMOM to schedule 3 month follow-up appointment. °

## 2022-12-25 NOTE — Progress Notes (Signed)
Office Visit Note  Patient: Derek Lee.             Date of Birth: [birth year]           MRN: [id number]             PCP: Patient, No Pcp Per Referring: No ref. provider found Visit Date: 01/06/2023   Subjective:  Follow-up (Doing good)   History of Present Illness: Derek Lee. is a 47 y.o. male here for follow up for chronic gout now on allopurinol 450 mg daily and colchicine as needed.  Since her last visit he feels symptoms are overall doing much better has not suffered any serious flareups with the swelling heat or severe pain as before.  Has noticed some increase in symptoms after activity such as eating a large amount of shellfish at the beach with some alcohol and takes colchicine as needed when he starts to get pain.  Mobility remains limited by chronic pain and decreased mobility at his foot and ankle especially on the left side.   Previous HPI 08/08/2022 Derek Pro. is a 47 y.o. 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 Lab testing at initial visit showed uric acid of 7.6 so allopurinol was titrated to 300 mg daily. He had another flare up starting in the right elbow with warmth, swelling, and pain and started taking ibuprofen 800 mg for this then colchicine 0.6 mg 2 tablets daily and symptoms improved. Started to have some symptoms in the left elbow but without developing a full flare or swelling. Symptoms stayed improved with colchicine daily for the following week.   Previous HPI 07/10/22 Derek Lee is a 47 y.o. male here for evaluation of positive ANA checked associated with recurrent joint pain in multiple areas particularly affecting him in bilateral feet knees elbows and left shoulder pain.  He has history of gout since more than 5 years ago episodes of pain and swelling that have been present in either foot but is currently on maintenance with 200 mg allopurinol and feels like  these types of flares are now infrequent only once or twice per year.  During about the 3 to 4 years he started having different joint pain with inflammation starting at 1 spot that would travel around for about 2 weeks total at a time.  This did not improve with colchicine as he had typically seen with gout flares.  He takes ibuprofen during these or gets steroid injection or oral taper which are beneficial.  He notices warmth pain and stiffness in the joints without much visible swelling.  During these episodes his foot or knee pain are so bad he cannot tolerate walking any significant distance and has to use a wheelchair or just does not move around at all for prolonged times.  More recently he is having joint soreness even outside of these flareups such that he cannot stand on his feet for more than maybe 1 hour at most and walking distance is limited.  Previous imaging demonstrated capsulitis in the foot as well as some midfoot degenerative changes and questionable peroneal tendinopathy.   Labs reviewed 02/2022 ANA 1:40 DFS dsDNA, Scl-70, Sm, SSA, SSB neg RF neg CCP neg ESR 22 CRP 1 Uric acid 6.7 HLA-B27 neg   Review of Systems  Constitutional:  Negative for fatigue.  HENT:  Negative for mouth sores and mouth dryness.   Eyes:  Negative  for dryness.  Respiratory:  Negative for shortness of breath.   Cardiovascular:  Negative for chest pain and palpitations.  Gastrointestinal:  Negative for blood in stool, constipation and diarrhea.  Endocrine: Negative for increased urination.  Genitourinary:  Negative for involuntary urination.  Musculoskeletal:  Positive for gait problem and morning stiffness. Negative for joint pain, joint pain, joint swelling, myalgias, muscle weakness, muscle tenderness and myalgias.  Skin:  Positive for sensitivity to sunlight. Negative for color change, rash and hair loss.  Allergic/Immunologic: Negative for susceptible to infections.  Neurological:  Negative for  dizziness and headaches.  Hematological:  Negative for swollen glands.  Psychiatric/Behavioral:  Negative for depressed mood and sleep disturbance. The patient is not nervous/anxious.     PMFS History:  Patient Active Problem List   Diagnosis Date Noted  . Inflammatory arthritis 08/08/2022  . Idiopathic chronic gout, unspecified site, without tophus (tophi) 07/10/2022  . Peroneal tendinitis, left 03/27/2022  . Fracture of humeral shaft, right, closed 02/16/2017  . Hyperlipidemia     Past Medical History:  Diagnosis Date  . Asthma   . Gout   . Hyperlipidemia   . Sleep apnea    noncompliant with cpap    Family History  Problem Relation Age of Onset  . Diabetes Mother   . High blood pressure Father    Past Surgical History:  Procedure Laterality Date  . ORIF HUMERUS FRACTURE Right 02/16/2017   Procedure: OPEN REDUCTION INTERNAL FIXATION (ORIF) RIGHT HUMERAL SHAFT FRACTURE;  Surgeon: [name], MD;  Location: [facility];  Service: Orthopedics;  Laterality: Right;   Social History   Social History Narrative   Right handed    There is no immunization history on file for this patient.   Objective: Vital Signs: BP 124/76 (BP Location: Left Arm, Patient Position: Sitting, Cuff Size: Normal)   Pulse 76   Resp 15   Ht 5\' 7"  (1.702 m)   BMI 32.89 kg/m    Physical Exam Constitutional:      Comments: In wheelchair  Eyes:     Conjunctiva/sclera: Conjunctivae normal.  Cardiovascular:     Rate and Rhythm: Normal rate and regular rhythm.  Pulmonary:     Effort: Pulmonary effort is normal.     Breath sounds: Normal breath sounds.  Musculoskeletal:     Right lower leg: No edema.     Left lower leg: No edema.  Lymphadenopathy:     Cervical: No cervical adenopathy.  Skin:    General: Skin is warm and dry.     Findings: No rash.  Neurological:     Mental Status: He is alert.  Psychiatric:        Mood and Affect: Mood normal.     Musculoskeletal Exam:   Shoulders full ROM no tenderness or swelling Elbows full ROM no tenderness or swelling Wrists full ROM no tenderness or swelling Fingers full ROM no tenderness or swelling Knees full ROM no tenderness or swelling Ankles restricted ROM, no tenderness to pressure, no palpable swelling   Investigation: No additional findings.  Imaging: No results found.  Recent Labs: Lab Results  Component Value Date   WBC 7.6 11/13/2022   HGB 14.8 11/13/2022   PLT 237 11/13/2022   NA 138 11/13/2022   K 4.2 11/13/2022   CL 101 11/13/2022   CO2 29 11/13/2022   GLUCOSE 89 11/13/2022   BUN 9 11/13/2022   CREATININE 1.03 11/13/2022   BILITOT 0.8 11/13/2022   ALKPHOS 57 03/25/2022  AST 15 11/13/2022   ALT 12 11/13/2022   PROT 7.2 11/13/2022   ALBUMIN 4.2 03/25/2022   CALCIUM 9.4 11/13/2022   GFRAA >60 05/05/2015    Speciality Comments: No specialty comments available.  Procedures:  No procedures performed Allergies: Patient has no known allergies.   Assessment / Plan:     Visit Diagnoses: Idiopathic chronic gout of multiple sites without tophus - 11/13/2022 Uric Acid 4.2 - Plan: Sedimentation rate, COMPLETE METABOLIC PANEL WITH GFR, Uric acid  Gout appears to well-controlled no more severe exacerbation for several months. Recheck uric acid level now at allopurinol 450 mg daily if remains at goal can just follow this up in 6 months.  Checking a complete metabolic panel as well for long-term use of higher dose allopurinol and as needed colchicine.  Inflammatory arthritis  Previously some concern for possible CPPD with chondrocalcinosis on imaging but I think this is less likely a major contributor given his improvement on allopurinol.  Rechecking sed rate for evidence of ongoing inflammation.    Medication monitoring encounter - allopurinol 450 mg daily, colchicine at 0.6 mg as needed  Chronic pain of both ankles - Plan: Ambulatory referral to Orthopedic Surgery  Chronic ankle pain  with some restricted mobility at this point he also has deconditioning with long-term wheelchair lower crutch use.  I think at this point is more related to chronic tendinopathy or degenerative joint changes versus inflammatory disease.  Will refer to orthopedic surgery for evaluation whether he needs any type of physical therapy versus orthotics or procedure at this time.  Orders: Orders Placed This Encounter  Procedures  . Sedimentation rate  . COMPLETE METABOLIC PANEL WITH GFR  . Uric acid  . Ambulatory referral to Orthopedic Surgery   No orders of the defined types were placed in this encounter.    Follow-Up Instructions: Return in about 6 months (around 07/09/2023) for gout on allopurinol/colchicine f/u 6mos.   Fuller Plan, MD  Note - This record has been created using [employer].  Chart creation errors have been sought, but may not always  have been located. Such creation errors do not reflect on  the standard of medical care.

## 2023-01-06 ENCOUNTER — Encounter: Payer: Self-pay | Admitting: Internal Medicine

## 2023-01-06 ENCOUNTER — Ambulatory Visit: Payer: Medicaid Other | Attending: Internal Medicine | Admitting: Internal Medicine

## 2023-01-06 VITALS — BP 124/76 | HR 76 | Resp 15 | Ht 67.0 in

## 2023-01-06 DIAGNOSIS — M1A09X0 Idiopathic chronic gout, multiple sites, without tophus (tophi): Secondary | ICD-10-CM | POA: Diagnosis not present

## 2023-01-06 DIAGNOSIS — M25571 Pain in right ankle and joints of right foot: Secondary | ICD-10-CM

## 2023-01-06 DIAGNOSIS — Z5181 Encounter for therapeutic drug level monitoring: Secondary | ICD-10-CM | POA: Diagnosis not present

## 2023-01-06 DIAGNOSIS — G8929 Other chronic pain: Secondary | ICD-10-CM

## 2023-01-06 DIAGNOSIS — M25572 Pain in left ankle and joints of left foot: Secondary | ICD-10-CM

## 2023-01-06 DIAGNOSIS — M1990 Unspecified osteoarthritis, unspecified site: Secondary | ICD-10-CM | POA: Diagnosis not present

## 2023-01-06 LAB — SEDIMENTATION RATE: Sed Rate: 2 mm/h (ref 0–15)

## 2023-04-15 ENCOUNTER — Other Ambulatory Visit: Payer: Self-pay | Admitting: Orthopaedic Surgery

## 2023-04-15 DIAGNOSIS — M25572 Pain in left ankle and joints of left foot: Secondary | ICD-10-CM

## 2023-04-15 DIAGNOSIS — M25571 Pain in right ankle and joints of right foot: Secondary | ICD-10-CM

## 2023-05-01 ENCOUNTER — Other Ambulatory Visit: Payer: Self-pay | Admitting: Internal Medicine

## 2023-05-01 DIAGNOSIS — M1A09X0 Idiopathic chronic gout, multiple sites, without tophus (tophi): Secondary | ICD-10-CM

## 2023-05-01 NOTE — Telephone Encounter (Signed)
Last Fill: 12/08/2022  Labs: 01/06/2023 CMP normal  11/13/2022 CBC  MPV 12.9  01/06/2023 Uric Acid 4.7  Next Visit: 07/09/2023  Last Visit: 01/06/2023  DX:  Idiopathic chronic gout of multiple sites without tophus   Current Dose per office note 01/06/2023: allopurinol 450 mg   Contacted the patient to let him know his CBC lab is about to be due. Patient states he can come into the office in a couple of weeks and get the lab drawn. Patient states he would also like to recheck his Uric Acid as well. Both labs have been pended. Please associate with correct diagnoses.   Okay to refill Allopurinol?

## 2023-05-09 ENCOUNTER — Ambulatory Visit
Admission: RE | Admit: 2023-05-09 | Discharge: 2023-05-09 | Disposition: A | Discharge status: Home / Self Care | Payer: Medicaid Other | Source: Ambulatory Visit | Attending: Orthopaedic Surgery | Admitting: Orthopaedic Surgery

## 2023-05-09 DIAGNOSIS — M25571 Pain in right ankle and joints of right foot: Secondary | ICD-10-CM

## 2023-05-09 DIAGNOSIS — M25572 Pain in left ankle and joints of left foot: Secondary | ICD-10-CM

## 2023-05-28 ENCOUNTER — Encounter: Payer: Self-pay | Admitting: Neurology

## 2023-06-25 NOTE — Progress Notes (Signed)
NEUROLOGY CONSULTATION NOTE  Derek Lee. MRN: [id number] DOB: 12-07-1975  Referring provider: Terance Hart, MD Primary care provider: Adelene Amas, DO  Reason for consult:  evaluate for demyelinating disease  Assessment/Plan:   Abnormal white matter on brain MRI Ataxia Bilateral ankle/feet weakness Primary concern is demyelinating disease such as MS.  Also consider a lumbosacral spinal stenosis.  Upper extremity numbness may be carpal tunnel syndrome  Check MRI of neuro-axis:  Brain, cervical, thoracic, lumbar spine with and without contrast Pending results, continue workup with LP and blood work vs other testing Schedule already to start PT. Follow up after testing.   Subjective:  Derek Lee is a 48 year old right-handed male with gout how presents for evaluation of demyelinating disease.  History supplemented by referring provider's note.  He has history of gout and possible inflammatory arthritis followed by rheumatology.  In 2021, he began experiencing pain in the feet that impeded his ability to bear weight and walk.  He was noted to have a postural tremor in the left leg.  He then noticed tremors in the hands as well.  He also endorsed paresthesias in the arms and hands upon awakening in the morning.  Denies upper extremity weakness.  Seen by neurology at that time.  MRI of brain without contrast revealed nonspecific supratentorial and infratentorial white matter disease and MRI of cervical spine with and without contrast showed cervical spondylosis but no significant stenosis or spinal cord abnormalities.  There was concern for MS but patient was unable to continue follow up and workup due to loss of insurance.  Since then, he continues to become weaker.  He is mostly sedentary and unable to stand now more than 30 seconds.  He is unsteady on his feet.  He was sent to orthopedics.  MRI of the bilateral ankles on 05/09/2023 showed increased muscular edema  and degenerative changes bilaterally but would not explain his symptoms. Denies bowel and bladder dysfunction.  He notices that when he is in the shower or soaking his feet in hot water, his pain is worse.  He had an eye exam and was found to be far-sighted, otherwise no concerning vision loss.    IMAGING: 12/09/2019 MRI BRAIN WO:  No acute intracranial process.  Peripheral predominant supratentorial and infratentorial white matter T2 hyperintensities. Differential includes vasculitis, demyelinating foci, chronic microvascular ischemic changes and post infectious/inflammatory sequela. 12/01/2019 MRI C-SPINE W WO:  Cervical spondylosis as described and most notably as follows. At C3-C4, there is mild/moderate disc degeneration. Shallow disc bulge. Uncinate hypertrophy (greater on the left). No significant spinal canal stenosis. Mild left neural foraminal narrowing.  No spinal cord signal abnormality or abnormal cord enhancement demonstrated within the cervical spine.  LABS: 01/06/2023:  Sed rate 2 07/10/2022:  RPR non-reactive 03/25/2022:  positive ANA, negative ENA panel, negative RF, sed rate 22, CRP 1 02/20/2020:  Sed rate 8, negative CRP, CK 101, B12 282, TSH 1.864, free T4 1  FH:  Father (gout).  No family history of MS.  PAST MEDICAL HISTORY: Past Medical History:  Diagnosis Date   Asthma    Gout    Hyperlipidemia    Sleep apnea    noncompliant with cpap    PAST SURGICAL HISTORY: Past Surgical History:  Procedure Laterality Date   ORIF HUMERUS FRACTURE Right 02/16/2017   Procedure: OPEN REDUCTION INTERNAL FIXATION (ORIF) RIGHT HUMERAL SHAFT FRACTURE;  Surgeon: [name], MD;  Location: [facility];  Service:  Orthopedics;  Laterality: Right;    MEDICATIONS: Current Outpatient Medications on File Prior to Visit  Medication Sig Dispense Refill   allopurinol (ZYLOPRIM) 300 MG tablet TAKE 1 AND 1/2 TABLETS(450 MG) BY MOUTH DAILY 135 tablet 0   colchicine 0.6 MG  tablet Take 1 tablet (0.6 mg total) by mouth daily. 30 tablet 2   ibuprofen (ADVIL,MOTRIN) 800 MG tablet Take 1 tablet (800 mg total) by mouth every 8 (eight) hours as needed (for knee pain). 21 tablet 0   metoprolol succinate (TOPROL-XL) 25 MG 24 hr tablet Take 1 tablet by mouth daily.     MITIGARE 0.6 MG CAPS Take 1 capsule (0.6 mg total) by mouth daily. 30 capsule 2   No current facility-administered medications on file prior to visit.    ALLERGIES: No Known Allergies  FAMILY HISTORY: Family History  Problem Relation Age of Onset   Diabetes Mother    High blood pressure Father     Objective:  Blood pressure 121/79, pulse 89, height 5\' 7"  (1.702 m), weight 220 lb (99.8 kg), SpO2 99%. General: No acute distress.  Patient appears well-groomed.   Head:  Normocephalic/atraumatic Eyes:  fundi examined but not visualized Neck: supple, no paraspinal tenderness, full range of motion Back: No paraspinal tenderness Heart: regular rate and rhythm Lungs: Clear to auscultation bilaterally. Vascular: No carotid bruits. Neurological Exam: Mental status: alert and oriented to person, place, and time, speech fluent and not dysarthric, language intact. Cranial nerves: CN I: not tested CN II: pupils equal, round and reactive to light, visual fields intact CN III, IV, VI:  full range of motion, no nystagmus, no ptosis CN V: facial sensation intact. CN VII: upper and lower face symmetric CN VIII: hearing intact CN IX, X: gag intact, uvula midline CN XI: sternocleidomastoid and trapezius muscles intact CN XII: tongue midline Bulk & Tone: normal, no fasciculations. Motor:  muscle strength 5-/5 ankle dorsiflexion, plantar flexion, inversion and EHLs bilatrally, otherwise 5/5 throughout Sensation:  Pinprick, temperature and vibratory sensation intact. Deep Tendon Reflexes:  3+ in patellars bilaterally, otherwise 2+ throughout,  toes downgoing.   Finger to nose testing:  Without dysmetria.     Gait:  Wide-based gait.  Romberg negative.    Thank you for allowing me to take part in the care of this patient.  Shon Millet, DO  CC:  Adelene Amas, DO  Dub Mikes, MD

## 2023-06-25 NOTE — Progress Notes (Deleted)
 Office Visit Note  Patient: Derek Lee.             Date of Birth: 1976/03/16           MRN: [id number]             PCP: Patient, No Pcp Per Referring: No ref. provider found Visit Date: 07/09/2023   Subjective:  No chief complaint on file.   History of Present Illness: Derek Lee. is a 48 y.o. male here for follow up for chronic gout now on allopurinol 450 mg daily and colchicine as needed.    Previous HPI 01/06/2023 Derek Pro. is a 48 y.o. male here for follow up for chronic gout now on allopurinol 450 mg daily and colchicine as needed.  Since her last visit he feels symptoms are overall doing much better has not suffered any serious flareups with the swelling heat or severe pain as before.  Has noticed some increase in symptoms after activity such as eating a large amount of shellfish at the beach with some alcohol and takes colchicine as needed when he starts to get pain.  Mobility remains limited by chronic pain and decreased mobility at his foot and ankle especially on the left side.     Previous HPI 08/08/2022 Derek Pro. is a 48 y.o. 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 Lab testing at initial visit showed uric acid of 7.6 so allopurinol was titrated to 300 mg daily. He had another flare up starting in the right elbow with warmth, swelling, and pain and started taking ibuprofen 800 mg for this then colchicine 0.6 mg 2 tablets daily and symptoms improved. Started to have some symptoms in the left elbow but without developing a full flare or swelling. Symptoms stayed improved with colchicine daily for the following week.   Previous HPI 07/10/22 Derek Lee is a 48 y.o. male here for evaluation of positive ANA checked associated with recurrent joint pain in multiple areas particularly affecting him in bilateral feet knees elbows and left shoulder pain.  He has history of  gout since more than 5 years ago episodes of pain and swelling that have been present in either foot but is currently on maintenance with 200 mg allopurinol and feels like these types of flares are now infrequent only once or twice per year.  During about the 3 to 4 years he started having different joint pain with inflammation starting at 1 spot that would travel around for about 2 weeks total at a time.  This did not improve with colchicine as he had typically seen with gout flares.  He takes ibuprofen during these or gets steroid injection or oral taper which are beneficial.  He notices warmth pain and stiffness in the joints without much visible swelling.  During these episodes his foot or knee pain are so bad he cannot tolerate walking any significant distance and has to use a wheelchair or just does not move around at all for prolonged times.  More recently he is having joint soreness even outside of these flareups such that he cannot stand on his feet for more than maybe 1 hour at most and walking distance is limited.  Previous imaging demonstrated capsulitis in the foot as well as some midfoot degenerative changes and questionable peroneal tendinopathy.   Labs reviewed 02/2022 ANA 1:40 DFS dsDNA, Scl-70, Sm, SSA, SSB neg RF neg CCP  neg ESR 22 CRP 1 Uric acid 6.7 HLA-B27 neg   No Rheumatology ROS completed.   PMFS History:  Patient Active Problem List   Diagnosis Date Noted   Inflammatory arthritis 08/08/2022   Idiopathic chronic gout, unspecified site, without tophus (tophi) 07/10/2022   Peroneal tendinitis, left 03/27/2022   Fracture of humeral shaft, right, closed 02/16/2017   Hyperlipidemia     Past Medical History:  Diagnosis Date   Asthma    Gout    Hyperlipidemia    Sleep apnea    noncompliant with cpap    Family History  Problem Relation Age of Onset   Diabetes Mother    High blood pressure Father    Past Surgical History:  Procedure Laterality Date   ORIF  HUMERUS FRACTURE Right 02/16/2017   Procedure: OPEN REDUCTION INTERNAL FIXATION (ORIF) RIGHT HUMERAL SHAFT FRACTURE;  Surgeon: [name], MD;  Location: [facility];  Service: Orthopedics;  Laterality: Right;   Social History   Social History Narrative   Right handed    There is no immunization history on file for this patient.   Objective: Vital Signs: There were no vitals taken for this visit.   Physical Exam   Musculoskeletal Exam: ***  CDAI Exam: CDAI Score: -- Patient Global: --; Provider Global: -- Swollen: --; Tender: -- Joint Exam 07/09/2023   No joint exam has been documented for this visit   There is currently no information documented on the homunculus. Go to the Rheumatology activity and complete the homunculus joint exam.  Investigation: No additional findings.  Imaging: No results found.  Recent Labs: Lab Results  Component Value Date   WBC 7.6 11/13/2022   HGB 14.8 11/13/2022   PLT 237 11/13/2022   NA 139 01/06/2023   K 4.3 01/06/2023   CL 100 01/06/2023   CO2 28 01/06/2023   GLUCOSE 81 01/06/2023   BUN 11 01/06/2023   CREATININE 1.11 01/06/2023   BILITOT 0.6 01/06/2023   ALKPHOS 57 03/25/2022   AST 22 01/06/2023   ALT 34 01/06/2023   PROT 7.2 01/06/2023   ALBUMIN 4.2 03/25/2022   CALCIUM 9.4 01/06/2023   GFRAA >60 05/05/2015    Speciality Comments: No specialty comments available.  Procedures:  No procedures performed Allergies: Patient has no known allergies.   Assessment / Plan:     Visit Diagnoses: No diagnosis found.  ***  Orders: No orders of the defined types were placed in this encounter.  No orders of the defined types were placed in this encounter.    Follow-Up Instructions: No follow-ups on file.   Metta Clines, RT  Note - This record has been created using [employer].  Chart creation errors have been sought, but may not always  have been located. Such creation errors do not reflect on   the standard of medical care.

## 2023-06-26 ENCOUNTER — Encounter: Payer: Self-pay | Admitting: Neurology

## 2023-06-26 ENCOUNTER — Ambulatory Visit: Payer: Medicaid Other | Admitting: Neurology

## 2023-06-26 VITALS — BP 121/79 | HR 89 | Ht 67.0 in | Wt 220.0 lb

## 2023-06-26 DIAGNOSIS — R270 Ataxia, unspecified: Secondary | ICD-10-CM

## 2023-06-26 DIAGNOSIS — R9082 White matter disease, unspecified: Secondary | ICD-10-CM | POA: Diagnosis not present

## 2023-06-26 DIAGNOSIS — R29898 Other symptoms and signs involving the musculoskeletal system: Secondary | ICD-10-CM

## 2023-06-26 NOTE — Patient Instructions (Addendum)
MRI of brain, cervical/thoracic/lumbar with and without contrast A referral to [facility] has been placed for your MRI someone will contact you directly to schedule your appt. They are located at [address] [facility]. Please contact them directly by calling [phone] with any questions regarding your referral.  Further recommendations pending results.

## 2023-07-06 ENCOUNTER — Encounter: Payer: Self-pay | Admitting: Neurology

## 2023-07-09 ENCOUNTER — Ambulatory Visit: Payer: Medicaid Other | Admitting: Internal Medicine

## 2023-07-09 DIAGNOSIS — G8929 Other chronic pain: Secondary | ICD-10-CM

## 2023-07-09 DIAGNOSIS — M1990 Unspecified osteoarthritis, unspecified site: Secondary | ICD-10-CM

## 2023-07-09 DIAGNOSIS — Z5181 Encounter for therapeutic drug level monitoring: Secondary | ICD-10-CM

## 2023-07-09 DIAGNOSIS — M1A09X0 Idiopathic chronic gout, multiple sites, without tophus (tophi): Secondary | ICD-10-CM

## 2023-07-10 ENCOUNTER — Encounter: Payer: Self-pay | Admitting: Neurology

## 2023-07-14 ENCOUNTER — Ambulatory Visit
Admission: RE | Admit: 2023-07-14 | Discharge: 2023-07-14 | Disposition: A | Discharge status: Home / Self Care | Payer: Medicaid Other | Source: Ambulatory Visit | Attending: Neurology

## 2023-07-14 ENCOUNTER — Ambulatory Visit
Admission: RE | Admit: 2023-07-14 | Discharge: 2023-07-14 | Disposition: A | Discharge status: Home / Self Care | Payer: Medicaid Other | Source: Ambulatory Visit | Attending: Neurology | Admitting: Neurology

## 2023-07-14 DIAGNOSIS — R9082 White matter disease, unspecified: Secondary | ICD-10-CM

## 2023-07-14 DIAGNOSIS — R270 Ataxia, unspecified: Secondary | ICD-10-CM

## 2023-07-14 DIAGNOSIS — R29898 Other symptoms and signs involving the musculoskeletal system: Secondary | ICD-10-CM

## 2023-07-14 MED ORDER — GADOPICLENOL 0.5 MMOL/ML IV SOLN
10.0000 mL | Freq: Once | INTRAVENOUS | Status: AC | PRN
  Administered 2023-07-14: 10 mL via INTRAVENOUS

## 2023-07-16 ENCOUNTER — Other Ambulatory Visit: Payer: Medicaid Other

## 2023-07-30 ENCOUNTER — Ambulatory Visit
Admission: RE | Admit: 2023-07-30 | Discharge: 2023-07-30 | Disposition: A | Discharge status: Home / Self Care | Payer: Medicaid Other | Source: Ambulatory Visit | Attending: Neurology | Admitting: Neurology

## 2023-07-30 DIAGNOSIS — R270 Ataxia, unspecified: Secondary | ICD-10-CM

## 2023-07-30 DIAGNOSIS — R29898 Other symptoms and signs involving the musculoskeletal system: Secondary | ICD-10-CM

## 2023-07-30 DIAGNOSIS — R9082 White matter disease, unspecified: Secondary | ICD-10-CM

## 2023-07-30 MED ORDER — GADOPICLENOL 0.5 MMOL/ML IV SOLN
10.0000 mL | Freq: Once | INTRAVENOUS | Status: AC | PRN
  Administered 2023-07-30: 10 mL via INTRAVENOUS

## 2023-08-03 ENCOUNTER — Other Ambulatory Visit: Payer: Self-pay | Admitting: Internal Medicine

## 2023-08-03 DIAGNOSIS — M1990 Unspecified osteoarthritis, unspecified site: Secondary | ICD-10-CM

## 2023-08-03 DIAGNOSIS — M1A09X0 Idiopathic chronic gout, multiple sites, without tophus (tophi): Secondary | ICD-10-CM

## 2023-08-03 DIAGNOSIS — Z5181 Encounter for therapeutic drug level monitoring: Secondary | ICD-10-CM

## 2023-08-03 NOTE — Telephone Encounter (Signed)
 Last Fill: 05/01/2023   Labs: 01/06/2023 CMP  uric acid 4.7 11/13/2022 MPV 12.9 I called patient, patient will come this week for labs.  Next Visit: 08/25/2023  Last Visit: 01/06/2023  DX: Idiopathic chronic gout of multiple sites without tophus   Current Dose per office note 01/06/2023: allopurinol 450 mg daily,   Okay to refill Allopurinol?

## 2023-08-03 NOTE — Telephone Encounter (Signed)
 Is the future uric acid still in place as well? If not add new one else okay.

## 2023-08-12 NOTE — Progress Notes (Unsigned)
 Office Visit Note  Patient: Derek Lee.             Date of Birth: [birth year]           MRN: [id number]             PCP: Mattie Marlin, DO Referring: Mattie Marlin, DO Visit Date: 08/25/2023   Subjective:  Follow-up   History of Present Illness: Derek Troiani. is a 48 y.o. male here for follow up for chronic gout now on allopurinol 450 mg daily and colchicine as needed.    Previous HPI 01/06/2023 Derek Pro. is a 48 y.o. male here for follow up for chronic gout now on allopurinol 450 mg daily and colchicine as needed.  Since her last visit he feels symptoms are overall doing much better has not suffered any serious flareups with the swelling heat or severe pain as before.  Has noticed some increase in symptoms after activity such as eating a large amount of shellfish at the beach with some alcohol and takes colchicine as needed when he starts to get pain.  Mobility remains limited by chronic pain and decreased mobility at his foot and ankle especially on the left side.     Previous HPI 08/08/2022 Derek Pro. is a 48 y.o. 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 Lab testing at initial visit showed uric acid of 7.6 so allopurinol was titrated to 300 mg daily. He had another flare up starting in the right elbow with warmth, swelling, and pain and started taking ibuprofen 800 mg for this then colchicine 0.6 mg 2 tablets daily and symptoms improved. Started to have some symptoms in the left elbow but without developing a full flare or swelling. Symptoms stayed improved with colchicine daily for the following week.   Previous HPI 07/10/22 Derek Lee is a 48 y.o. male here for evaluation of positive ANA checked associated with recurrent joint pain in multiple areas particularly affecting him in bilateral feet knees elbows and left shoulder pain.  He has history of gout since more than  5 years ago episodes of pain and swelling that have been present in either foot but is currently on maintenance with 200 mg allopurinol and feels like these types of flares are now infrequent only once or twice per year.  During about the 3 to 4 years he started having different joint pain with inflammation starting at 1 spot that would travel around for about 2 weeks total at a time.  This did not improve with colchicine as he had typically seen with gout flares.  He takes ibuprofen during these or gets steroid injection or oral taper which are beneficial.  He notices warmth pain and stiffness in the joints without much visible swelling.  During these episodes his foot or knee pain are so bad he cannot tolerate walking any significant distance and has to use a wheelchair or just does not move around at all for prolonged times.  More recently he is having joint soreness even outside of these flareups such that he cannot stand on his feet for more than maybe 1 hour at most and walking distance is limited.  Previous imaging demonstrated capsulitis in the foot as well as some midfoot degenerative changes and questionable peroneal tendinopathy.   Labs reviewed 02/2022 ANA 1:40 DFS dsDNA, Scl-70, Sm, SSA, SSB neg RF neg CCP neg ESR 22 CRP  1 Uric acid 6.7 HLA-B27 neg   Review of Systems  Constitutional:  Negative for fatigue.  HENT:  Negative for mouth sores and mouth dryness.   Eyes:  Negative for dryness.  Respiratory:  Negative for shortness of breath.   Cardiovascular:  Negative for chest pain and palpitations.  Gastrointestinal:  Negative for blood in stool, constipation and diarrhea.  Endocrine: Negative for increased urination.  Genitourinary:  Negative for involuntary urination.  Musculoskeletal:  Positive for joint pain, gait problem, joint pain, muscle weakness, morning stiffness and muscle tenderness. Negative for joint swelling, myalgias and myalgias.  Skin:  Positive for sensitivity to  sunlight. Negative for color change, rash and hair loss.  Allergic/Immunologic: Negative for susceptible to infections.  Neurological:  Negative for dizziness and headaches.  Hematological:  Negative for swollen glands.  Psychiatric/Behavioral:  Negative for depressed mood and sleep disturbance. The patient is not nervous/anxious.     PMFS History:  Patient Active Problem List   Diagnosis Date Noted   Inflammatory arthritis 08/08/2022   Idiopathic chronic gout, unspecified site, without tophus (tophi) 07/10/2022   Peroneal tendinitis, left 03/27/2022   Fracture of humeral shaft, right, closed 02/16/2017   Hyperlipidemia     Past Medical History:  Diagnosis Date   Asthma    Gout    Hyperlipidemia    Sleep apnea    noncompliant with cpap    Family History  Problem Relation Age of Onset   Diabetes Mother    High blood pressure Father    Past Surgical History:  Procedure Laterality Date   ORIF HUMERUS FRACTURE Right 02/16/2017   Procedure: OPEN REDUCTION INTERNAL FIXATION (ORIF) RIGHT HUMERAL SHAFT FRACTURE;  Surgeon: [name], MD;  Location: [facility];  Service: Orthopedics;  Laterality: Right;   Social History   Social History Narrative   Right handed    There is no immunization history on file for this patient.   Objective: Vital Signs: BP 100/69 (BP Location: Left Arm, Patient Position: Sitting, Cuff Size: Large)   Pulse 83   Resp 16   Ht 5\' 7"  (1.702 m)   BMI 34.46 kg/m    Physical Exam Constitutional:      Appearance: He is obese.     Comments: In wheelchair  Cardiovascular:     Rate and Rhythm: Normal rate and regular rhythm.  Pulmonary:     Effort: Pulmonary effort is normal.     Breath sounds: Normal breath sounds.  Musculoskeletal:     Right lower leg: No edema.     Left lower leg: No edema.  Skin:    General: Skin is warm and dry.  Neurological:     Mental Status: He is alert.  Psychiatric:        Mood and Affect: Mood  normal.      Musculoskeletal Exam:  Shoulders full ROM no tenderness or swelling Elbows full ROM no tenderness or swelling Wrists full ROM no tenderness or swelling Fingers full ROM no tenderness or swelling Knees full ROM no tenderness or swelling, right patellofemoral crepitus Ankles restrcited ROM b/l without tenderness to pressure or palpable effusions, some weakness in multiple movements   Investigation: No additional findings.  Imaging: MR BRAIN W WO CONTRAST Result Date: 08/18/2023 CLINICAL DATA:  48 year old male with nonspecific cerebral white matter changes on 2021 brain MRI. Ataxia. EXAM: MRI HEAD WITHOUT AND WITH CONTRAST TECHNIQUE: Multiplanar, multiecho pulse sequences of the brain and surrounding structures were obtained without and with intravenous  contrast. CONTRAST:  10 mL Vueway COMPARISON:  Cervical spine MRI the same day reported separately. Previous brain MRI 12/09/2019. FINDINGS: Brain: Cerebral volume is stable and within normal limits. No restricted diffusion to suggest acute infarction. No midline shift, mass effect, evidence of mass lesion, ventriculomegaly, extra-axial collection or acute intracranial hemorrhage. Cervicomedullary junction and pituitary are within normal limits. No cortical encephalomalacia or chronic cerebral blood products (T2 *). Bilateral deep gray nuclei, brainstem, cerebellum appear normal. No abnormal enhancement identified. No dural thickening. Chronic widely scattered cerebral white matter T2 and FLAIR hyperintensity, small foci affecting subcortical more so than central or periventricular white matter. No temporal lobe involvement. Corpus callosum appears to remain normal. Compared to 2021 no significant progression. Vascular: Major intracranial vascular flow voids are stable since 2021. Following contrast the major dural venous sinuses are enhancing and appear to be patent. Skull and upper cervical spine: Cervical spine detailed separately.  Visualized bone marrow signal is within normal limits. Sinuses/Orbits: Symmetric,, negative orbits. Chronic maxillary sinus mucosal thickening on the left, mild posterior ethmoid bubbly opacity on the right. Other: Mastoids are clear. Visible internal auditory structures appear normal. Negative visible scalp and face. IMPRESSION: 1.  No acute intracranial abnormality. 2. Chronic cerebral white matter signal changes, nonspecific and without significant progression since 2021. Pattern NOT strongly suggestive of demyelinating disease. Other differential considerations include accelerated/hereditary small vessel ischemia, sequelae of trauma, hypercoagulable state, vasculitis, migraines, prior infection. Electronically Signed   By: Odessa Fleming M.D.   On: 08/18/2023 13:42   MR CERVICAL SPINE W WO CONTRAST Result Date: 08/18/2023 CLINICAL DATA:  Ataxia EXAM: MRI CERVICAL SPINE WITHOUT AND WITH CONTRAST TECHNIQUE: Multiplanar and multiecho pulse sequences of the cervical spine, to include the craniocervical junction and cervicothoracic junction, were obtained without and with intravenous contrast. CONTRAST:  10 mL Vueway, 0 mL wasted COMPARISON:  12/01/2019 FINDINGS: Alignment: No static listhesis. Loss of the normal cervical lordosis with straightening. Vertebrae: No acute fracture, evidence of discitis, or aggressive bone lesion. Cord: Normal signal and morphology. Posterior Fossa, vertebral arteries, paraspinal tissues: Posterior fossa demonstrates no focal abnormality. Vertebral artery flow voids are maintained. Paraspinal soft tissues are unremarkable. Disc levels: Discs: Mild disc height loss at C3-4. C2-3: No significant disc bulge. No neural foraminal stenosis. No central canal stenosis. C3-4: Mild disc bulge. Mild bilateral uncovertebral degenerative changes. Mild right and moderate left foraminal stenosis. No central canal stenosis. C4-5: Tiny central disc protrusion. No foraminal or central canal stenosis. C5-6:  Tiny left paracentral disc protrusion. No foraminal or central canal stenosis. C6-7: Mild disc bulge. Mild bilateral foraminal stenosis. No central canal stenosis. None C7-T1: No significant disc bulge. No neural foraminal stenosis. No central canal stenosis. IMPRESSION: 1. Mild cervical spine spondylosis as described above. 2. No acute osseous injury of the cervical spine. Electronically Signed   By: Elige Ko M.D.   On: 08/18/2023 12:59    Recent Labs: Lab Results  Component Value Date   WBC 7.6 11/13/2022   HGB 14.8 11/13/2022   PLT 237 11/13/2022   NA 139 01/06/2023   K 4.3 01/06/2023   CL 100 01/06/2023   CO2 28 01/06/2023   GLUCOSE 81 01/06/2023   BUN 11 01/06/2023   CREATININE 1.11 01/06/2023   BILITOT 0.6 01/06/2023   ALKPHOS 57 03/25/2022   AST 22 01/06/2023   ALT 34 01/06/2023   PROT 7.2 01/06/2023   ALBUMIN 4.2 03/25/2022   CALCIUM 9.4 01/06/2023   GFRAA >60 05/05/2015    Speciality  Comments: No specialty comments available.  Procedures:  No procedures performed Allergies: Bee pollen   Assessment / Plan:     Visit Diagnoses: Idiopathic chronic gout of multiple sites without tophus - 01/06/2023 Uric acid 4.7 - Plan: Uric acid, Comprehensive metabolic panel with GFR, Sedimentation rate  Medication monitoring encounter - allopurinol 450 mg daily, colchicine at 0.6 mg as needed  Inflammatory arthritis  Chronic pain of both ankles - Will refer to orthopedic surgery for evaluation, referral placed 01/06/2023  Idiopathic chronic gout of multiple sites without tophus - Plan: Uric acid, Comprehensive metabolic panel with GFR, Sedimentation rate  Medication monitoring encounter  ***  Orders: Orders Placed This Encounter  Procedures   Uric acid   Comprehensive metabolic panel with GFR   Sedimentation rate   No orders of the defined types were placed in this encounter.    Follow-Up Instructions: Return in about 6 months (around 02/24/2024) for Gout/ankles  f/u 6mos.   Fuller Plan, MD  Note - This record has been created using [employer].  Chart creation errors have been sought, but may not always  have been located. Such creation errors do not reflect on  the standard of medical care.

## 2023-08-25 ENCOUNTER — Encounter: Payer: Self-pay | Admitting: Internal Medicine

## 2023-08-25 ENCOUNTER — Ambulatory Visit: Payer: Medicaid Other | Attending: Internal Medicine | Admitting: Internal Medicine

## 2023-08-25 VITALS — BP 100/69 | HR 83 | Resp 16 | Ht 67.0 in

## 2023-08-25 DIAGNOSIS — M1990 Unspecified osteoarthritis, unspecified site: Secondary | ICD-10-CM

## 2023-08-25 DIAGNOSIS — M25571 Pain in right ankle and joints of right foot: Secondary | ICD-10-CM | POA: Diagnosis not present

## 2023-08-25 DIAGNOSIS — M1A09X0 Idiopathic chronic gout, multiple sites, without tophus (tophi): Secondary | ICD-10-CM

## 2023-08-25 DIAGNOSIS — Z5181 Encounter for therapeutic drug level monitoring: Secondary | ICD-10-CM | POA: Diagnosis not present

## 2023-08-25 DIAGNOSIS — G8929 Other chronic pain: Secondary | ICD-10-CM

## 2023-08-25 DIAGNOSIS — M25572 Pain in left ankle and joints of left foot: Secondary | ICD-10-CM

## 2023-08-26 ENCOUNTER — Telehealth: Payer: Self-pay

## 2023-08-26 DIAGNOSIS — R29898 Other symptoms and signs involving the musculoskeletal system: Secondary | ICD-10-CM

## 2023-08-26 DIAGNOSIS — R9082 White matter disease, unspecified: Secondary | ICD-10-CM

## 2023-08-26 DIAGNOSIS — R270 Ataxia, unspecified: Secondary | ICD-10-CM

## 2023-08-26 LAB — COMPREHENSIVE METABOLIC PANEL WITH GFR
AG Ratio: 1.8 (calc) (ref 1.0–2.5)
ALT: 16 U/L (ref 9–46)
AST: 14 U/L (ref 10–40)
Albumin: 4.6 g/dL (ref 3.6–5.1)
Alkaline phosphatase (APISO): 64 U/L (ref 36–130)
BUN: 13 mg/dL (ref 7–25)
CO2: 28 mmol/L (ref 20–32)
Calcium: 9.5 mg/dL (ref 8.6–10.3)
Chloride: 103 mmol/L (ref 98–110)
Creat: 0.91 mg/dL (ref 0.60–1.29)
Globulin: 2.6 g/dL (ref 1.9–3.7)
Glucose, Bld: 85 mg/dL (ref 65–99)
Potassium: 4.5 mmol/L (ref 3.5–5.3)
Sodium: 139 mmol/L (ref 135–146)
Total Bilirubin: 0.4 mg/dL (ref 0.2–1.2)
Total Protein: 7.2 g/dL (ref 6.1–8.1)
eGFR: 105 mL/min/{1.73_m2} (ref >=60)

## 2023-08-26 LAB — SEDIMENTATION RATE: Sed Rate: 6 mm/h (ref 0–15)

## 2023-08-26 LAB — URIC ACID: Uric Acid, Serum: 6.3 mg/dL (ref 4.0–8.0)

## 2023-08-26 NOTE — Addendum Note (Signed)
 Addended by: Fuller Plan on: 08/26/2023 09:19 AM   Modules accepted: Orders

## 2023-08-26 NOTE — Telephone Encounter (Signed)
-----   Message from Cira Servant sent at 08/21/2023  7:10 AM EDT ----- MRI of brain again shows spots on the brain that are nonspecific.  I had also ordered an MRI of the cervical spine with and without contrast which does not appear to have been ordered.

## 2023-08-26 NOTE — Telephone Encounter (Signed)
 Patient advised MRI Cervical pine with and without contrast ordered.

## 2023-08-27 ENCOUNTER — Telehealth: Payer: Self-pay | Admitting: Neurology

## 2023-08-27 NOTE — Progress Notes (Signed)
 Patient advised of his results. Patient wanted to make sure Dr.Jaffe didn't have anything add.

## 2023-08-27 NOTE — Telephone Encounter (Signed)
 See results note.

## 2023-08-27 NOTE — Telephone Encounter (Signed)
 Pt. Still awaiting results contact, please call asap

## 2023-09-01 ENCOUNTER — Telehealth: Payer: Self-pay

## 2023-09-01 DIAGNOSIS — R9082 White matter disease, unspecified: Secondary | ICD-10-CM

## 2023-09-01 DIAGNOSIS — R29898 Other symptoms and signs involving the musculoskeletal system: Secondary | ICD-10-CM

## 2023-09-01 NOTE — Telephone Encounter (Signed)
-----   Message from Cira Servant sent at 08/30/2023  6:14 PM EDT ----- I do not think that he has MS.  Findings on brain MRI are nonspecific and often of unknown clinical significance.  However, I would like to check a blood test to look for a potential cause that may affect blood vessels in the head - pan-ANCA panel.  As for the lower extremity weakness, would like to order NCV-EMG of lower extremities for bilateral food drop.  Please explain the procedure. ----- Message ----- From: Leida Lauth, CMA Sent: 08/27/2023   2:18 PM EDT To: Drema Dallas, DO  Patient advised of his results. Patient wanted to make sure Dr.Jaffe didn't have anything add.

## 2023-09-01 NOTE — Telephone Encounter (Signed)
 Patient advised,Patient agrees to do the EMG and Blood work.

## 2023-09-10 ENCOUNTER — Ambulatory Visit: Admitting: Neurology

## 2023-09-10 ENCOUNTER — Other Ambulatory Visit

## 2023-09-10 DIAGNOSIS — R29898 Other symptoms and signs involving the musculoskeletal system: Secondary | ICD-10-CM | POA: Diagnosis not present

## 2023-09-10 NOTE — Procedures (Signed)
 [facility]  [address] [place], [address]  [place], [place] [address] Tel: [phone] Fax: [phone] Test Date:  09/10/2023  Patient: Derek Lee DOB: [birth year] Physician: Nita Sickle, DO  Sex: Male Height: 5\' 7"  Ref Phys: Shon Millet, DO  ID#: [id number]   Technician:    History: This is a 48 year old man referred for evaluation of bilateral leg weakness.  NCV & EMG Findings: Electrodiagnostic testing of the right lower extremity and additional studies of the left shows: Bilateral sural and superficial peroneal sensory responses are within normal limits. Bilateral peroneal and tibial motor responses are within normal limits. Bilateral tibial H reflex studies are within normal limits. There is no evidence of active or chronic motor axonal changes affecting any of the tested muscles.  Motor unit configuration and recruitment pattern is within normal limits.   Impression: This is a normal study of the lower extremities.  In particular, there is no evidence of a large fiber sensorimotor polyneuropathy or lumbosacral radiculopathy.    ___________________________ Nita Sickle, DO    Nerve Conduction Studies   Stim Site NR Peak (ms) Norm Peak (ms) O-P Amp (V) Norm O-P Amp  Left Sup Peroneal Anti Sensory (Ant Lat Mall)  32 C  12 cm    3.5 <4.5 40.2 >5  Right Sup Peroneal Anti Sensory (Ant Lat Mall)  32 C  12 cm    3.7 <4.5 46.3 >5  Left Sural Anti Sensory (Lat Mall)  32 C  Calf    3.7 <4.5 42.2 >5  Right Sural Anti Sensory (Lat Mall)  32 C  Calf    3.2 <4.5 46.4 >5     Stim Site NR Onset (ms) Norm Onset (ms) O-P Amp (mV) Norm O-P Amp Site1 Site2 Delta-0 (ms) Dist (cm) Vel (m/s) Norm Vel (m/s)  Left Peroneal Motor (Ext Dig Brev)  32 C  Ankle    5.2 <5.5 9.3 >3 B Fib Ankle 8.0 36.0 45 >40  B Fib    13.2  9.1  Poplt B Fib 1.5 8.0 53 >40  Poplt    14.7  9.1         Right Peroneal Motor (Ext Dig Brev)  32 C  Ankle    4.5 <5.5 13.1 >3 B Fib Ankle 7.3  39.0 53 >40  B Fib    11.8  13.0  Poplt B Fib 1.5 8.0 53 >40  Poplt    13.3  12.7         Left Tibial Motor (Abd Hall Brev)  32 C  Ankle    4.3 <6.0 8.4 >8 Knee Ankle 8.7 43.0 49 >40  Knee    13.0  7.2         Right Tibial Motor (Abd Hall Brev)  32 C  Ankle    4.3 <6.0 8.0 >8 Knee Ankle 9.5 42.0 44 >40  Knee    13.8  6.7          Electromyography   Side Muscle Ins.Act Fibs Fasc Recrt Amp Dur Poly Activation Comment  Right AntTibialis Nml Nml Nml Nml Nml Nml Nml Nml N/A  Right Gastroc Nml Nml Nml Nml Nml Nml Nml Nml N/A  Right Flex Dig Long Nml Nml Nml Nml Nml Nml Nml Nml N/A  Right RectFemoris Nml Nml Nml Nml Nml Nml Nml Nml N/A  Right GluteusMed Nml Nml Nml Nml Nml Nml Nml Nml N/A  Left AntTibialis Nml Nml Nml Nml Nml Nml Nml Nml N/A  Left  Gastroc Nml Nml Nml Nml Nml Nml Nml Nml N/A  Left Flex Dig Long Nml Nml Nml Nml Nml Nml Nml Nml N/A  Left RectFemoris Nml Nml Nml Nml Nml Nml Nml Nml N/A  Left GluteusMed Nml Nml Nml Nml Nml Nml Nml Nml N/A      Waveforms:

## 2023-09-16 LAB — ANCA SCREEN W REFLEX TITER: ANCA SCREEN: NEGATIVE

## 2023-09-16 NOTE — Progress Notes (Signed)
 Patient advised.

## 2023-09-18 ENCOUNTER — Other Ambulatory Visit: Payer: Self-pay

## 2023-09-18 ENCOUNTER — Ambulatory Visit (HOSPITAL_COMMUNITY): Attending: Internal Medicine

## 2023-09-18 DIAGNOSIS — M25572 Pain in left ankle and joints of left foot: Secondary | ICD-10-CM | POA: Insufficient documentation

## 2023-09-18 DIAGNOSIS — M25571 Pain in right ankle and joints of right foot: Secondary | ICD-10-CM | POA: Diagnosis not present

## 2023-09-18 DIAGNOSIS — R262 Difficulty in walking, not elsewhere classified: Secondary | ICD-10-CM | POA: Insufficient documentation

## 2023-09-18 DIAGNOSIS — G8929 Other chronic pain: Secondary | ICD-10-CM | POA: Insufficient documentation

## 2023-09-18 DIAGNOSIS — R29898 Other symptoms and signs involving the musculoskeletal system: Secondary | ICD-10-CM | POA: Insufficient documentation

## 2023-09-18 DIAGNOSIS — Z7409 Other reduced mobility: Secondary | ICD-10-CM | POA: Insufficient documentation

## 2023-09-18 NOTE — Therapy (Signed)
 OUTPATIENT PHYSICAL THERAPY LOWER EXTREMITY EVALUATION   Patient Name: Derek Lee. MRN: [id number] DOB:[birth year], 48 y.o., male Today's Date: 09/22/2023  END OF SESSION:  PT End of Session - 09/22/23 1306     Visit Number 1    Date for PT Re-Evaluation 10/30/23    Authorization Type [place] MEDICAID [employer] CARIT[employer] OF [place]    Authorization Time Period seeking auth    Authorization - Visit Number 1    Progress Note Due on Visit 10    PT Start Time 1430    PT Stop Time 1515    PT Time Calculation (min) 45 min    Activity Tolerance Patient tolerated treatment well;Patient limited by fatigue    Behavior During Therapy WFL for tasks assessed/performed             Past Medical History:  Diagnosis Date   Asthma    Gout    Hyperlipidemia    Sleep apnea    noncompliant with cpap   Past Surgical History:  Procedure Laterality Date   ORIF HUMERUS FRACTURE Right 02/16/2017   Procedure: OPEN REDUCTION INTERNAL FIXATION (ORIF) RIGHT HUMERAL SHAFT FRACTURE;  Surgeon: Micheline Ahr, MD;  Location: [facility];  Service: Orthopedics;  Laterality: Right;   Patient Active Problem List   Diagnosis Date Noted   Inflammatory arthritis 08/08/2022   Idiopathic chronic gout, unspecified site, without tophus (tophi) 07/10/2022   Peroneal tendinitis, left 03/27/2022   Fracture of humeral shaft, right, closed 02/16/2017   Hyperlipidemia     PCP: Orlena Bitters, DO   REFERRING PROVIDER: Matt Song, MD  REFERRING DIAG: M25.571,G89.29,M25.572 (ICD-10-CM) - Chronic pain of both ankles  THERAPY DIAG:  Weakness of both lower extremities  Difficulty walking  Impaired functional mobility, balance, gait, and endurance  Rationale for Evaluation and Treatment: Rehabilitation  ONSET DATE: 2 years ago  SUBJECTIVE:   SUBJECTIVE STATEMENT: Pt reports history of gout attacks in which pt remains bedridden for weeks at a time. Pt has had severe gout in  bilateral feet a couple of times. Pt does most of home chores while remaining in wheel chair due to pain and decreased endurance. Pt has been in the [facility] for about 2 years. Pt also reports arthritis in bilateral feet. Pt states he feels that both ankles are weak and have been for a while. Pt thought he could have possibly multiple sclerosis but doctors have told him otherwise. Pt has been out of work for some time now. Pt states he has to limit his time WB on his feet although he can stand for a considerable amount of time due to increased pain in the following day. Pt does not attempt to climb stairs, he scoots up them. Pt has lost 60lbs in the last year and walks about 4-5 times a week, tries to get out of WC once a day unless he is in pain.  PERTINENT HISTORY: -Gout -Multiple injection therapies attempted PAIN:  Are you having pain? Yes: NPRS scale: 6-7/10 Pain location: both ankles Pain description: tight Aggravating factors: walking, standing, steps, walking up a incline Relieving factors: rest   PRECAUTIONS: None  RED FLAGS: None   WEIGHT BEARING RESTRICTIONS: No  FALLS:  Has patient fallen in last 6 months? 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  DIAGNOSTIC FINDINGS:  CLINICAL DATA:  Bilateral leg weakness, ataxia   EXAM: MRI THORACIC AND LUMBAR SPINE WITHOUT AND WITH CONTRAST   TECHNIQUE: Multiplanar and multiecho pulse sequences of the thoracic and lumbar spine were obtained  without and with intravenous contrast.   CONTRAST:  Vueway    COMPARISON:  None Available.   FINDINGS: MRI THORACIC SPINE   Alignment:  Preserved.   Vertebrae: Vertebral body heights are maintained. No marrow edema. No suspicious lesion.   Cord:  No abnormal signal.  No abnormal intrathecal enhancement.   Paraspinal and other soft tissues: Unremarkable.   Disc levels: Trace central disc protrusion at T5-T6. Mild right facet hypertrophy at T4-T5. No stenosis at any level.   MRI LUMBAR SPINE   Segmentation:  Standard.   Alignment:  Preserved.   Vertebrae: Vertebral body heights are maintained. No marrow edema. No suspicious lesion.   Conus medullaris: Extends to the L1 level and appears normal. No abnormal intrathecal enhancement.   Paraspinal and other soft tissues: Unremarkable.   Disc levels: Intervertebral disc heights and signal are maintained. No stenosis.   IMPRESSION: No abnormal cord signal or enhancement. No significant degenerative changes.  PATIENT SURVEYS:   LEFS 26/80  COGNITION: Overall cognitive status: Within functional limits for tasks assessed     SENSATION: WFL, numbness on left side on occasion in morning (sleeps on left side)  EDEMA:  Last gout attack 6 months ago, no swelling since  POSTURE: rounded shoulders and forward head  PALPATION: Pt is able to tolerate palpation of bilateral ankles with minimal tenderness.   LOWER EXTREMITY ROM:  Active ROM Right eval Left eval  Hip flexion    Hip extension    Hip abduction    Hip adduction    Hip internal rotation    Hip external rotation    Knee flexion    Knee extension    Ankle dorsiflexion    Ankle plantarflexion    Ankle inversion    Ankle eversion     (Blank rows = not tested)  LOWER EXTREMITY MMT:  MMT Right eval Left eval  Hip flexion 4 4  Hip extension    Hip abduction 4 4  Hip adduction 4- 4-  Hip internal rotation    Hip external rotation    Knee flexion  3+ 4  Knee extension 4 4  Ankle dorsiflexion 4-, pain 3+, pain  Ankle plantarflexion    Ankle inversion 3+ 3+  Ankle eversion 3+ 3+   (Blank rows = not tested)   FUNCTIONAL TESTS:  5 times sit to stand: 25.69 Timed up and go (TUG): 28.92  GAIT: Distance walked: 25 feet Assistive device utilized:  Pt presents in [facility] but is able to walk with no AD during the TUG. Level of assistance: Complete Independence Comments: Pt demonstrates antalgic gait, decreased velocity, and increased lateral sway bilaterally.  TREATMENT DATE:  09/18/2023   Evaluation: -ROM measured, Strength assessed, HEP prescribed, pt educated on prognosis, findings, and importance of HEP compliance if given.        PATIENT EDUCATION:  Education details: Pt was educated on findings of PT evaluation, prognosis, frequency of therapy visits and rationale, attendance policy, and HEP if given.   Person educated: Patient Education method: Explanation, Verbal cues, and Handouts Education comprehension: verbalized understanding and needs further education  HOME EXERCISE PROGRAM: Access Code: [email or web address] URL: [email or web address] Date: 09/18/2023 Prepared by: Armond Bertin  Exercises - Seated Heel Toe Raises  - 1 x daily - 5 x weekly - 3 sets - 10 reps - Sit to Stand with Arms Crossed  - 1 x daily - 5 x weekly - 3 sets - 10 reps - Standing balance  - 1 x daily - 5 x weekly - 3 sets - 10 reps  ASSESSMENT:  CLINICAL IMPRESSION: Patient is a 48 y.o. male who was seen today for physical therapy evaluation and treatment for M25.571,G89.29,M25.572 (ICD-10-CM) - Chronic pain of both ankles.   OBJECTIVE IMPAIRMENTS: Abnormal gait, decreased activity tolerance, decreased balance, decreased coordination, decreased knowledge of use of DME, decreased mobility, difficulty walking, decreased  strength, and pain.   ACTIVITY LIMITATIONS: carrying, lifting, bending, standing, squatting, stairs, transfers, bed mobility, locomotion level, and caring for others  PARTICIPATION LIMITATIONS: cleaning, laundry, shopping, community activity, occupation, and yard work  PERSONAL FACTORS: Fitness, Past/current experiences, Time since onset of injury/illness/exacerbation, and 1 comorbidity: hyperlipidemia  are also affecting patient's functional outcome.   REHAB POTENTIAL: Fair been in [facility] for 2 years  CLINICAL DECISION MAKING: Evolving/moderate complexity  EVALUATION COMPLEXITY: Moderate   GOALS: Goals reviewed with patient? No  SHORT TERM GOALS: Target date: 10/09/23  Patient will demonstrate evidence of independence with individualized HEP and will report compliance for at least 3 days per week for optimized progression towards remaining therapy goals. Baseline:  Goal status: INITIAL  2.  Patient will report a decrease in pain level during community ambulation by at least 2 points for improved quality of life. Baseline: 7/10 Goal status: INITIAL     LONG TERM GOALS: Target date: 10/30/23  Pt will demonstrate a an increase of at least 9 points on the LEFS for improved performance of community ambulation and ADL. Baseline: see objective Goal status: INITIAL  2.  Pt will improve TUG by at least 2 seconds in order to demonstrate improved functional ambulatory capacity in community setting.  Baseline: see objective Goal status: INITIAL   3.  Pt will demonstrate at least 4-/5 MMT for right lower extremity for increased strength during ADL and community ambulation. Baseline: see objective Goal status: INITIAL  4.  Pt will improve 5TSTS by at least 2.3 seconds in order to improve functional transfers during community navigation. Baseline: see objective Goal status: INITIAL    PLAN:  PT FREQUENCY: 2x/week  PT DURATION: 6 weeks  PLANNED INTERVENTIONS: 97110-Therapeutic  exercises, 97530- Therapeutic activity, 97112- Neuromuscular re-education, 97535- Self Care, 86578- Manual therapy, [phone]- Gait training, Patient/Family education, Balance training, Stair training, Joint mobilization, DME instructions, Wheelchair mobility training, Cryotherapy, and Moist heat  PLAN FOR NEXT SESSION: Review goals and HEP, progress endurance training, gait training, BLE strengthening   Armond Bertin, PT, DPT [facility] Office: [phone] 1:39 PM, 09/22/23   Managed Medicaid Authorization Request  Visit Dx Codes: W10.272 ; R26.2 ; Z74.09 ; M25.571,G89.29,M25.572   Functional Tool Score: LEFS 26/80  For all possible  CPT codes, reference the Planned Interventions line above.     Check all conditions that are expected to impact treatment: {Conditions expected to impact treatment:Musculoskeletal disorders and Neurological condition and/or seizures   If treatment provided at initial evaluation, no treatment charged due to lack of authorization.

## 2023-09-22 ENCOUNTER — Encounter (HOSPITAL_COMMUNITY): Payer: Self-pay

## 2023-10-01 ENCOUNTER — Encounter (HOSPITAL_COMMUNITY)

## 2023-10-08 ENCOUNTER — Encounter (HOSPITAL_COMMUNITY)

## 2023-10-14 ENCOUNTER — Telehealth (HOSPITAL_COMMUNITY): Payer: Self-pay

## 2023-10-14 ENCOUNTER — Encounter (HOSPITAL_COMMUNITY)

## 2023-10-14 NOTE — Telephone Encounter (Signed)
 No show #1, called and spoke to pt who stated he had forgotten about today. Stated he has been in extra pain in the last couple days and has been wheelchair boud. Reminded next apt date and time and included contact number if needs to cancel/reschedule in the future.   Minor Amble, LPTA/CLT; Johnye Napoleon [phone]

## 2023-10-21 ENCOUNTER — Ambulatory Visit (HOSPITAL_COMMUNITY): Attending: Internal Medicine | Admitting: Physical Therapy

## 2023-10-21 DIAGNOSIS — R29898 Other symptoms and signs involving the musculoskeletal system: Secondary | ICD-10-CM | POA: Diagnosis present

## 2023-10-21 DIAGNOSIS — Z7409 Other reduced mobility: Secondary | ICD-10-CM

## 2023-10-21 DIAGNOSIS — R262 Difficulty in walking, not elsewhere classified: Secondary | ICD-10-CM | POA: Diagnosis present

## 2023-10-21 NOTE — Therapy (Signed)
 OUTPATIENT PHYSICAL THERAPY LOWER EXTREMITY TREATMENT  Patient Name: Derek Lee. MRN: [id number] DOB:August 16, 1975, 48 y.o., male Today's Date: 10/21/2023  END OF SESSION:  PT End of Session - 10/21/23 1446     Visit Number 2    Number of Visits 13    Date for PT Re-Evaluation 10/30/23    Authorization Type [place] MEDICAID [employer] CARITAS OF     Authorization Time Period 12 visits approved 4/25-6/30    Authorization - Visit Number 1    Authorization - Number of Visits 12    Progress Note Due on Visit 10    PT Start Time 1440    PT Stop Time 1515    PT Time Calculation (min) 35 min    Activity Tolerance Patient tolerated treatment well;Patient limited by fatigue    Behavior During Therapy WFL for tasks assessed/performed             Past Medical History:  Diagnosis Date   Asthma    Gout    Hyperlipidemia    Sleep apnea    noncompliant with cpap   Past Surgical History:  Procedure Laterality Date   ORIF HUMERUS FRACTURE Right 02/16/2017   Procedure: OPEN REDUCTION INTERNAL FIXATION (ORIF) RIGHT HUMERAL SHAFT FRACTURE;  Surgeon: Micheline Ahr, MD;  Location: [facility];  Service: Orthopedics;  Laterality: Right;   Patient Active Problem List   Diagnosis Date Noted   Inflammatory arthritis 08/08/2022   Idiopathic chronic gout, unspecified site, without tophus (tophi) 07/10/2022   Peroneal tendinitis, left 03/27/2022   Fracture of humeral shaft, right, closed 02/16/2017   Hyperlipidemia     PCP: Orlena Bitters, DO   REFERRING PROVIDER: Matt Song, MD  REFERRING DIAG: M25.571,G89.29,M25.572 (ICD-10-CM) - Chronic pain of both ankles  THERAPY DIAG:  Weakness of both lower extremities  Difficulty walking  Impaired functional mobility, balance, gait, and endurance  Rationale for Evaluation and Treatment: Rehabilitation  ONSET DATE: 2 years ago  SUBJECTIVE:   SUBJECTIVE STATEMENT: Pt returns following one month since  evaluation.  Reports compliance with HEP and states he didn't come last week because his achilles tendon was hurting on his Lt from the exercises.  Currently without pain.  Remains in a wheelchair today and seat is nearly fully ripped out (suggested he get another one).    Pt reports history of gout attacks in which pt remains bedridden for weeks at a time. Pt has had severe gout in bilateral feet a couple of times. Pt does most of home chores while remaining in wheel chair due to pain and decreased endurance. Pt has been in the [facility] for about 2 years. Pt also reports arthritis in bilateral feet. Pt states he feels that both ankles are weak and have been for a while. Pt thought he could have possibly multiple sclerosis but doctors have told him otherwise. Pt has been out of work for some time now. Pt states he has to limit his time WB on his feet although he can stand for a considerable amount of time due to increased pain in the following day. Pt does not attempt to climb stairs, he scoots up them. Pt has lost 60lbs in the last year and walks about 4-5 times a week, tries to get out of WC once a day unless he is in pain.  PERTINENT HISTORY: -Gout -Multiple injection therapies attempted PAIN:  Are you having pain? Yes: NPRS scale: 6-7/10 Pain location: both ankles Pain description: tight Aggravating  factors: walking, standing, steps, walking up a incline Relieving factors: rest   PRECAUTIONS: None  RED FLAGS: None   WEIGHT BEARING RESTRICTIONS: No  FALLS:  Has patient fallen in last 6 months? 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  DIAGNOSTIC FINDINGS:  CLINICAL DATA:  Bilateral leg weakness, ataxia   EXAM: MRI THORACIC AND LUMBAR SPINE WITHOUT AND WITH CONTRAST   TECHNIQUE: Multiplanar and multiecho pulse sequences of the thoracic and lumbar spine were obtained without and with intravenous contrast.   CONTRAST:  Vueway    COMPARISON:  None Available.   FINDINGS: MRI THORACIC SPINE   Alignment:  Preserved.   Vertebrae: Vertebral body heights are maintained. No marrow edema. No suspicious lesion.   Cord:  No abnormal signal.  No abnormal intrathecal enhancement.   Paraspinal and other soft tissues: Unremarkable.   Disc levels: Trace central disc protrusion at T5-T6. Mild right facet hypertrophy at T4-T5. No stenosis at any level.   MRI LUMBAR SPINE   Segmentation:  Standard.   Alignment:  Preserved.   Vertebrae: Vertebral body heights are maintained. No marrow edema. No suspicious lesion.   Conus medullaris: Extends to the L1 level and appears normal. No abnormal intrathecal enhancement.   Paraspinal and other soft tissues: Unremarkable.   Disc levels: Intervertebral disc heights and signal are maintained. No stenosis.   IMPRESSION: No abnormal cord signal or enhancement. No significant degenerative changes.  PATIENT SURVEYS:   LEFS 26/80  COGNITION: Overall cognitive status: Within functional limits for tasks assessed     SENSATION: WFL, numbness on left side on occasion in morning (sleeps on left side)  EDEMA:  Last gout attack 6 months ago, no swelling since  POSTURE: rounded shoulders and forward head  PALPATION: Pt is able to tolerate palpation of bilateral ankles with minimal tenderness.   LOWER EXTREMITY ROM:  Active ROM Right eval Left eval  Hip flexion    Hip extension    Hip abduction    Hip adduction    Hip internal  rotation    Hip external rotation    Knee flexion    Knee extension    Ankle dorsiflexion    Ankle plantarflexion    Ankle inversion    Ankle eversion     (Blank rows = not tested)  LOWER EXTREMITY MMT:  MMT Right eval Left eval  Hip flexion 4 4  Hip extension    Hip abduction 4 4  Hip adduction 4- 4-  Hip internal rotation    Hip external rotation    Knee flexion 3+ 4  Knee extension 4 4  Ankle dorsiflexion 4-, pain 3+, pain  Ankle plantarflexion    Ankle inversion 3+ 3+  Ankle eversion 3+ 3+   (Blank rows = not tested)   FUNCTIONAL TESTS:  5 times sit to stand: 25.69 Timed up and go (TUG): 28.92  GAIT: Distance walked: 25 feet Assistive device utilized: Pt presents in [facility] but is able to walk with no AD during the TUG. Level of assistance: Complete  Independence Comments: Pt demonstrates antalgic gait, decreased velocity, and increased lateral sway bilaterally.                                                                                                                                TREATMENT DATE:  10/21/23 Goal review Sit to stand standard chair no UE 2X5 Standing: in one spot 45 seconds max  Gait without AD 60 feet X 1  Heelraises  2X5  Slant board stretch 30" (pt refused to do any further standing exercises)  09/18/2023  Evaluation: -ROM measured, Strength assessed, HEP prescribed, pt educated on prognosis, findings, and importance of HEP compliance if given.   PATIENT EDUCATION:  Education details: Pt was educated on findings of PT evaluation, prognosis, frequency of therapy visits and rationale, attendance policy, and HEP if given.   Person educated: Patient Education method: Explanation, Verbal cues, and Handouts Education comprehension: verbalized understanding and needs further education  HOME EXERCISE PROGRAM: Access Code: [email or web address] URL: [email or web address] Date: 09/18/2023 Prepared by: Armond Bertin  Exercises - Seated Heel  Toe Raises  - 1 x daily - 5 x weekly - 3 sets - 10 reps - Sit to Stand with Arms Crossed  - 1 x daily - 5 x weekly - 3 sets - 10 reps - Standing balance  - 1 x daily - 5 x weekly - 3 sets - 10 reps  ASSESSMENT:  CLINICAL IMPRESSION: Reviewed goals and POC moving forward.  Pt arrived in wheelchair but with good movement in LE's, drove to session today.  Encouraged pt to begin walking into session and verbalized understanding.  Pt ambulated max of 60 feet.  Very stiff LE and flat foot.  Worked on heel to toe and added heelraises in standing as well as gastroc stretch with slant board.  Pt completed one stretch and refused additional as his "feet are beginning to hurt".  Encouraged to increase his activity as completing minimally and explained he needs to built his activity tolerance up.  Able to complete sit to stand without UE assist today form standard chair also.  Pt will continue to benefit from skilled therapy.    OBJECTIVE IMPAIRMENTS: Abnormal gait, decreased activity tolerance, decreased balance, decreased coordination, decreased knowledge of use of DME, decreased mobility, difficulty walking, decreased strength, and pain.   ACTIVITY LIMITATIONS: carrying, lifting, bending, standing, squatting, stairs, transfers, bed mobility, locomotion level, and caring for others  PARTICIPATION LIMITATIONS: cleaning, laundry, shopping, community activity, occupation, and yard work  PERSONAL FACTORS: Fitness, Past/current experiences, Time since onset of injury/illness/exacerbation, and 1 comorbidity: hyperlipidemia are also affecting patient's functional outcome.   REHAB POTENTIAL: Fair been in [facility] for 2 years  CLINICAL DECISION MAKING: Evolving/moderate complexity  EVALUATION COMPLEXITY: Moderate   GOALS: Goals reviewed with patient? Yes  SHORT TERM GOALS: Target date: 10/09/23  Patient will demonstrate evidence of independence with individualized HEP and will report compliance for at least 3  days per week  for optimized progression towards remaining therapy goals. Baseline:  Goal status: IN PROGRESS  2.  Patient will report a decrease in pain level during community ambulation by at least 2 points for improved quality of life. Baseline: 7/10 Goal status: IN PROGRESS     LONG TERM GOALS: Target date: 10/30/23  Pt will demonstrate a an increase of at least 9 points on the LEFS for improved performance of community ambulation and ADL. Baseline: see objective Goal status: IN PROGRESS  2.  Pt will improve TUG by at least 2 seconds in order to demonstrate improved functional ambulatory capacity in community setting.  Baseline: see objective Goal status: IN PROGRESS   3.  Pt will demonstrate at least 4-/5 MMT for right lower extremity for increased strength during ADL and community ambulation. Baseline: see objective Goal status: IN PROGRESS  4.  Pt will improve 5TSTS by at least 2.3 seconds in order to improve functional transfers during community navigation. Baseline: see objective Goal status: IN PROGRESS    PLAN:  PT FREQUENCY: 2x/week  PT DURATION: 6 weeks  PLANNED INTERVENTIONS: 97110-Therapeutic exercises, 97530- Therapeutic activity, 97112- Neuromuscular re-education, 97535- Self Care, [id number]- Manual therapy, [phone]- Gait training, Patient/Family education, Balance training, Stair training, Joint mobilization, DME instructions, Wheelchair mobility training, Cryotherapy, and Moist heat  PLAN FOR NEXT SESSION: Progress endurance training, gait training, BLE strengthening.  Encourage increased ambulation/activity tolerance.  Try nustep.  Update HEP next session.   Lorenso Romance, PTA/CLT [facility] Ph: [phone] 4:06 PM, 10/21/23

## 2023-10-22 ENCOUNTER — Encounter (HOSPITAL_COMMUNITY)

## 2023-10-26 ENCOUNTER — Telehealth (HOSPITAL_COMMUNITY): Payer: Self-pay

## 2023-10-26 ENCOUNTER — Encounter (HOSPITAL_COMMUNITY)

## 2023-10-26 NOTE — Telephone Encounter (Signed)
 No show #3. Patient called and spoke with front desk staff after missing appointment on 10/26/23. Reports he forgot of appointment this day and reminded of next scheduled appointment.  3:13 PM, 10/26/23 Derek Lee, PT, DPT [facility]

## 2023-10-28 ENCOUNTER — Encounter (HOSPITAL_COMMUNITY)

## 2023-10-31 ENCOUNTER — Other Ambulatory Visit: Payer: Self-pay | Admitting: Internal Medicine

## 2023-10-31 DIAGNOSIS — M1990 Unspecified osteoarthritis, unspecified site: Secondary | ICD-10-CM

## 2023-10-31 DIAGNOSIS — M1A09X0 Idiopathic chronic gout, multiple sites, without tophus (tophi): Secondary | ICD-10-CM

## 2023-10-31 DIAGNOSIS — Z5181 Encounter for therapeutic drug level monitoring: Secondary | ICD-10-CM

## 2023-11-02 ENCOUNTER — Encounter (HOSPITAL_COMMUNITY)

## 2023-11-02 NOTE — Telephone Encounter (Signed)
 Please schedule patient a follow up visit. Patient due 02/24/2024. Thanks!

## 2023-11-02 NOTE — Telephone Encounter (Signed)
 Last Fill: 08/03/2023  Labs: 08/25/2023 CMP WNL  11/13/2022 CBC  MPV 12.9  08/25/2023 Uric Acid 6.3  Next Visit: Due 02/24/2024. Message sent to the front to schedule.   Last Visit: 08/25/2023  DX:  Idiopathic chronic gout of multiple sites without tophus   Current Dose per office note 08/25/2023: allopurinol  450 mg daily   Contacted the patient and the patient states he plans to stop by this week to update his CBC test. Patient states he would also like a refill of the Mitigare  as well. Patient states he prefers Mitigare  over Colchicine  per insurance and cost. There is already a future CBC order in the order review.   Okay to refill Allopurinol  and Mitigare ?

## 2023-11-03 ENCOUNTER — Ambulatory Visit: Admitting: Podiatry

## 2023-11-03 DIAGNOSIS — M7672 Peroneal tendinitis, left leg: Secondary | ICD-10-CM

## 2023-11-03 MED ORDER — MITIGARE 0.6 MG PO CAPS: 0.6000 mg | ORAL_CAPSULE | Freq: Every day | ORAL | 0 refills | Status: DC

## 2023-11-03 NOTE — Progress Notes (Signed)
  Subjective:  Patient ID: Derek Lee., male    DOB: April 03, 1976,  MRN: [id number]  Chief Complaint  Patient presents with   Foot Pain    Pt would like to discuss PRP     48 y.o. male presents with the above complaint.  Patient states his foot has been doing overall really well.  He now has pain to the lateral side of the foot.  Denies any other acute complaints.  Past Medical History:  Diagnosis Date   Asthma    Gout    Hyperlipidemia    Sleep apnea    noncompliant with cpap    Current Outpatient Medications:    allopurinol  (ZYLOPRIM ) 300 MG tablet, TAKE 1 AND 1/2 TABLETS(450 MG) BY MOUTH DAILY, Disp: 135 tablet, Rfl: 0   colchicine  0.6 MG tablet, Take 1 tablet (0.6 mg total) by mouth daily., Disp: 30 tablet, Rfl: 2   ibuprofen  (ADVIL ,MOTRIN ) 800 MG tablet, Take 1 tablet (800 mg total) by mouth every 8 (eight) hours as needed (for knee pain)., Disp: 21 tablet, Rfl: 0   metoprolol succinate (TOPROL-XL) 25 MG 24 hr tablet, Take 1 tablet by mouth daily., Disp: , Rfl:   Social History   Tobacco Use  Smoking Status Former   Current packs/day: 0.00   Types: Cigarettes   Quit date: 05/04/2014   Years since quitting: 9.5   Passive exposure: Never  Smokeless Tobacco Never    Allergies  Allergen Reactions   Bee Pollen    Objective:  There were no vitals filed for this visit. There is no height or weight on file to calculate BMI. Constitutional Well developed. Well nourished.  Vascular Dorsalis pedis pulses palpable bilaterally. Posterior tibial pulses palpable bilaterally. Capillary refill normal to all digits.  No cyanosis or clubbing noted. Pedal hair growth normal.  Neurologic Normal speech. Oriented to person, place, and time. Epicritic sensation to light touch grossly present bilaterally.  Dermatologic Nails well groomed and normal in appearance. No open wounds. No skin lesions.  Orthopedic: No further pain noted at the ATFL ligament.  Patient is now  having some pain in the left lateral foot peroneal tendon.  Pain on palpation.   Radiographs: None Assessment:   1. Peroneal tendinitis, left       Plan:  Patient was evaluated and treated and all questions answered.  Left lateral peroneal tendinitis -I believe patient will benefit from PRP injection in that area as well given that it is causing him a lot of pain.  He will think about me and will get back and see me when he is ready to undergo PRP injection to the lateral foot.  I discussed this with patient and he states understanding.  In the past that has helped him considerably.   No follow-ups on file.

## 2023-11-04 ENCOUNTER — Encounter (HOSPITAL_COMMUNITY)

## 2023-11-06 ENCOUNTER — Other Ambulatory Visit: Payer: Self-pay

## 2023-11-06 MED ORDER — COLCHICINE 0.6 MG PO TABS: 0.6000 mg | ORAL_TABLET | Freq: Every day | ORAL | 2 refills | Status: AC

## 2023-11-06 NOTE — Telephone Encounter (Signed)
 Patient states he needs Colchicine  instead of Mitigare  because his insurance actually prefers the generic. Advised the patient I would send Dr. Rodell Citrin a message about it since we had just sent in the Mitigare . Please send in Colchicine  and cancel Mitigare  if appropriate.

## 2023-11-09 ENCOUNTER — Encounter (HOSPITAL_COMMUNITY)

## 2023-11-11 ENCOUNTER — Encounter (HOSPITAL_COMMUNITY)

## 2023-11-16 ENCOUNTER — Encounter (HOSPITAL_COMMUNITY)

## 2023-11-16 ENCOUNTER — Telehealth (HOSPITAL_COMMUNITY): Payer: Self-pay

## 2023-11-16 NOTE — Telephone Encounter (Signed)
 Will count as NS #2 as pt reports he spoke with front desk staff prior to last documented NS Called patient regarding missed appointment this date. Left voicemail reminding of no show/cancellation policy and next scheduled visit on Wednesday 6/25.  5:49 PM, 11/16/23 Derek Lee, PT, DPT [facility] [place]

## 2023-11-16 NOTE — Progress Notes (Deleted)
 NEUROLOGY FOLLOW UP OFFICE NOTE  Derek Lee [id number]  Assessment/Plan:   Abnormal white matter on brain MRI Ataxia Bilateral ankle/feet weakness Primary concern is demyelinating disease such as MS.  Also consider a lumbosacral spinal stenosis.  Upper extremity numbness may be carpal tunnel syndrome  Check MRI of neuro-axis:  Brain, cervical, thoracic, lumbar spine with and without contrast Pending results, continue workup with LP and blood work vs other testing Schedule already to start PT. Follow up after testing.   Subjective:  Derek Lee is a 48 year old right-handed male with gout how follows up for evaluation of demyelinating disease.  Recent MRIs personally reviewed.  UPDATE: Underwent imaging: 07/14/2023 MRI T- & L-SPINE W WO:  No abnormal cord signal or enhancement. No significant degenerative changes. 07/30/2023 MRI BRAIN W WO:  1.  No acute intracranial abnormality. 2. Chronic cerebral white matter signal changes, nonspecific and without significant progression since 2021. Pattern NOT strongly suggestive of demyelinating disease. Other differential considerations include accelerated/hereditary small vessel ischemia, sequelae of trauma, hypercoagulable state, vasculitis, migraines, prior infection. 07/30/2023 MRI C-SPINE W WO:  1. Mild cervical spine spondylosis. 2. No acute osseous injury of the cervical spine.  HISTORY: He has history of gout and possible inflammatory arthritis followed by rheumatology.  In 2021, he began experiencing pain in the feet that impeded his ability to bear weight and walk.  He was noted to have a postural tremor in the left leg.  He then noticed tremors in the hands as well.  He also endorsed paresthesias in the arms and hands upon awakening in the morning.  Denies upper extremity weakness.  Seen by neurology at that time.  MRI of brain without contrast revealed nonspecific supratentorial and infratentorial white matter  disease and MRI of cervical spine with and without contrast showed cervical spondylosis but no significant stenosis or spinal cord abnormalities.  There was concern for MS but patient was unable to continue follow up and workup due to loss of insurance.  Since then, he continues to become weaker.  He is mostly sedentary and unable to stand now more than 30 seconds.  He is unsteady on his feet.  He was sent to orthopedics.  MRI of the bilateral ankles on 05/09/2023 showed increased muscular edema and degenerative changes bilaterally but would not explain his symptoms. Denies bowel and bladder dysfunction.  He notices that when he is in the shower or soaking his feet in hot water, his pain is worse.  He had an eye exam and was found to be far-sighted, otherwise no concerning vision loss.    IMAGING: 12/09/2019 MRI BRAIN WO:  No acute intracranial process.  Peripheral predominant supratentorial and infratentorial white matter T2 hyperintensities. Differential includes vasculitis, demyelinating foci, chronic microvascular ischemic changes and post infectious/inflammatory sequela. 12/01/2019 MRI C-SPINE W WO:  Cervical spondylosis as described and most notably as follows. At C3-C4, there is mild/moderate disc degeneration. Shallow disc bulge. Uncinate hypertrophy (greater on the left). No significant spinal canal stenosis. Mild left neural foraminal narrowing.  No spinal cord signal abnormality or abnormal cord enhancement demonstrated within the cervical spine.  LABS: 01/06/2023:  Sed rate 2 07/10/2022:  RPR non-reactive 03/25/2022:  positive ANA, negative ENA panel, negative RF, sed rate 22, CRP 1 02/20/2020:  Sed rate 8, negative CRP, CK 101, B12 282, TSH 1.864, free T4 1  FH:  Father (gout).  No family history of MS.  PAST MEDICAL HISTORY: Past Medical History:  Diagnosis  Date   Asthma    Gout    Hyperlipidemia    Sleep apnea    noncompliant with cpap    MEDICATIONS: Current Outpatient  Medications on File Prior to Visit  Medication Sig Dispense Refill   allopurinol  (ZYLOPRIM ) 300 MG tablet TAKE 1 AND 1/2 TABLETS(450 MG) BY MOUTH DAILY 135 tablet 0   colchicine  0.6 MG tablet Take 1 tablet (0.6 mg total) by mouth daily. 30 tablet 2   ibuprofen  (ADVIL ,MOTRIN ) 800 MG tablet Take 1 tablet (800 mg total) by mouth every 8 (eight) hours as needed (for knee pain). 21 tablet 0   metoprolol succinate (TOPROL-XL) 25 MG 24 hr tablet Take 1 tablet by mouth daily.     No current facility-administered medications on file prior to visit.    ALLERGIES: Allergies  Allergen Reactions   Bee Pollen     FAMILY HISTORY: Family History  Problem Relation Age of Onset   Diabetes Mother    High blood pressure Father       Objective:  *** General: No acute distress.  Patient appears well-groomed.   Head:  Normocephalic/atraumatic Eyes:  Fundi examined but not visualized Neck: supple, no paraspinal tenderness, full range of motion Heart:  Regular rate and rhythm Back: No paraspinal tenderness Neurological Exam: ***   Juliene Dunnings, DO  CC: Shawn Lazoff, DO

## 2023-11-17 ENCOUNTER — Ambulatory Visit: Payer: Medicaid Other | Admitting: Neurology

## 2023-11-17 ENCOUNTER — Encounter: Payer: Self-pay | Admitting: Neurology

## 2023-11-18 ENCOUNTER — Encounter (HOSPITAL_COMMUNITY)

## 2023-11-23 ENCOUNTER — Encounter (HOSPITAL_COMMUNITY)

## 2023-11-23 ENCOUNTER — Telehealth (HOSPITAL_COMMUNITY): Payer: Self-pay

## 2023-11-23 NOTE — Telephone Encounter (Signed)
 No Show  #3 Called patient regarding missed appointment this date. Left voicemail explaining No Show policy and if patient should want to return for services a new referral and re-evaluation would need to be completed. Patient discharged this date.   6:19 PM, 11/23/23 Rosaria Settler, PT, DPT [facility] [place]

## 2023-11-23 NOTE — Therapy (Signed)
 PHYSICAL THERAPY DISCHARGE SUMMARY  Visits from Start of Care: 2  Current functional level related to goals / functional outcomes: See last note on 5/28   Remaining deficits: See last note on 5/28   Education / Equipment: See last note on 5/28   Patient agrees to discharge. Patient goals were not met. Patient is being discharged due to not returning since the last visit.  6:20 PM, 11/23/23 Rosaria Settler, PT, DPT [facility] [place]

## 2024-01-01 ENCOUNTER — Other Ambulatory Visit

## 2024-01-22 ENCOUNTER — Other Ambulatory Visit

## 2024-01-29 ENCOUNTER — Other Ambulatory Visit: Payer: Self-pay

## 2024-01-29 ENCOUNTER — Emergency Department (HOSPITAL_COMMUNITY)
Admission: EM | Admit: 2024-01-29 | Discharge: 2024-01-29 | Disposition: A | Discharge status: Home / Self Care | Attending: Emergency Medicine | Admitting: Emergency Medicine

## 2024-01-29 ENCOUNTER — Encounter (HOSPITAL_COMMUNITY): Payer: Self-pay

## 2024-01-29 ENCOUNTER — Emergency Department (HOSPITAL_COMMUNITY)

## 2024-01-29 DIAGNOSIS — R200 Anesthesia of skin: Secondary | ICD-10-CM

## 2024-01-29 DIAGNOSIS — R29810 Facial weakness: Secondary | ICD-10-CM | POA: Insufficient documentation

## 2024-01-29 LAB — CBC WITH DIFFERENTIAL/PLATELET
Abs Immature Granulocytes: 0.04 K/uL (ref 0.00–0.07)
Basophils Absolute: 0.1 K/uL (ref 0.0–0.1)
Basophils Relative: 1 %
Eosinophils Absolute: 0.4 K/uL (ref 0.0–0.5)
Eosinophils Relative: 4 %
HCT: 46.5 % (ref 39.0–52.0)
Hemoglobin: 15.2 g/dL (ref 13.0–17.0)
Immature Granulocytes: 0 %
Lymphocytes Relative: 33 %
Lymphs Abs: 3.2 K/uL (ref 0.7–4.0)
MCH: 33 pg (ref 26.0–34.0)
MCHC: 32.7 g/dL (ref 30.0–36.0)
MCV: 100.9 fL — ABNORMAL HIGH (ref 80.0–100.0)
Monocytes Absolute: 0.6 K/uL (ref 0.1–1.0)
Monocytes Relative: 7 %
Neutro Abs: 5.4 K/uL (ref 1.7–7.7)
Neutrophils Relative %: 55 %
Platelets: 279 K/uL (ref 150–400)
RBC: 4.61 MIL/uL (ref 4.22–5.81)
RDW: 12.4 % (ref 11.5–15.5)
WBC: 9.7 K/uL (ref 4.0–10.5)
nRBC: 0 % (ref 0.0–0.2)

## 2024-01-29 LAB — BASIC METABOLIC PANEL WITH GFR
Anion gap: 12 (ref 5–15)
BUN: 18 mg/dL (ref 6–20)
CO2: 27 mmol/L (ref 22–32)
Calcium: 8.8 mg/dL — ABNORMAL LOW (ref 8.9–10.3)
Chloride: 100 mmol/L (ref 98–111)
Creatinine, Ser: 0.94 mg/dL (ref 0.61–1.24)
GFR, Estimated: >60 mL/min (ref >60)
Glucose, Bld: 98 mg/dL (ref 70–99)
Potassium: 3.2 mmol/L — ABNORMAL LOW (ref 3.5–5.1)
Sodium: 139 mmol/L (ref 135–145)

## 2024-01-29 NOTE — Discharge Instructions (Signed)
 Return to the emergency department if you develop any new and/or concerning issues.

## 2024-01-29 NOTE — ED Triage Notes (Addendum)
 Pov from home cc of left facial droop that lasted around 30 seconds after he woke up at 1am.  Went to bed at 11pm feeling fine.  Denies numbness or tingling elsewhere.

## 2024-01-29 NOTE — ED Notes (Addendum)
 ED Provider at bedside.

## 2024-01-29 NOTE — ED Notes (Signed)
 Patient transported to CT

## 2024-01-29 NOTE — ED Notes (Signed)
 ED Provider at bedside.

## 2024-01-29 NOTE — ED Provider Notes (Signed)
 [place] EMERGENCY DEPARTMENT AT [facility] Provider Note   CSN: [id number] Arrival date & time: 01/29/24  [id number]     Patient presents with: Facial Droop   Derek Lee. is a 48 y.o. male.   Patient is a 48 year old male with medical history of gout, inflammatory arthritis, hyperlipidemia.  Patient presenting today with complaints of left-sided facial numbness.  He went to bed at approximately 1130, then woke up at about 1 experiencing numbness to the left side of his face.  He looked in the mirror and thought that maybe he saw a droop.  Symptoms lasted for a few minutes, then seem to have resolved.  He denies any involvement of his arm or leg.  No visual disturbances.       Prior to Admission medications   Medication Sig Start Date End Date Taking? Authorizing Provider  allopurinol  (ZYLOPRIM ) 300 MG tablet TAKE 1 AND 1/2 TABLETS(450 MG) BY MOUTH DAILY 11/03/23   Rice, Lonni ORN, MD  colchicine  0.6 MG tablet Take 1 tablet (0.6 mg total) by mouth daily. 11/06/23   Rice, Lonni ORN, MD  ibuprofen  (ADVIL ,MOTRIN ) 800 MG tablet Take 1 tablet (800 mg total) by mouth every 8 (eight) hours as needed (for knee pain). 11/06/17   Molpus, John, MD  metoprolol succinate (TOPROL-XL) 25 MG 24 hr tablet Take 1 tablet by mouth daily. 03/01/20   [provider]    Allergies: Bee pollen    Review of Systems  All other systems reviewed and are negative.   Updated Vital Signs BP 110/65   Pulse 82   Resp 13   Ht 5' 7 (1.702 m)   Wt 99.8 kg   SpO2 99%   BMI 34.46 kg/m   Physical Exam Vitals and nursing note reviewed.  Constitutional:      General: He is not in acute distress.    Appearance: He is well-developed. He is not diaphoretic.  HENT:     Head: Normocephalic and atraumatic.  Eyes:     Extraocular Movements: Extraocular movements intact.     Pupils: Pupils are equal, round, and reactive to light.  Cardiovascular:     Rate and Rhythm: Normal rate  and regular rhythm.     Heart sounds: No murmur heard.    No friction rub.  Pulmonary:     Effort: Pulmonary effort is normal. No respiratory distress.     Breath sounds: Normal breath sounds. No wheezing or rales.  Abdominal:     General: Bowel sounds are normal. There is no distension.     Palpations: Abdomen is soft.     Tenderness: There is no abdominal tenderness.  Musculoskeletal:        General: Normal range of motion.     Cervical back: Normal range of motion and neck supple.  Skin:    General: Skin is warm and dry.  Neurological:     General: No focal deficit present.     Mental Status: He is alert and oriented to person, place, and time. Mental status is at baseline.     Cranial Nerves: No cranial nerve deficit.     Motor: No weakness.     Coordination: Coordination normal.     (all labs ordered are listed, but only abnormal results are displayed) Labs Reviewed  BASIC METABOLIC PANEL WITH GFR  CBC WITH DIFFERENTIAL/PLATELET    EKG: EKG Interpretation Date/Time:  Friday January 29 2024 03:28:55 EDT Ventricular Rate:  77 PR Interval:  132  QRS Duration:  94 QT Interval:  385 QTC Calculation: 436 R Axis:   77  Text Interpretation: Sinus rhythm Normal ECG Confirmed by Geroldine Berg ([id number]) on 01/29/2024 3:32:10 AM  Radiology: No results found.   Procedures   Medications Ordered in the ED - No data to display                                  Medical Decision Making Amount and/or Complexity of Data Reviewed Labs: ordered. Radiology: ordered.   Patient is a 48 year old male presenting with left-sided facial numbness as described in the HPI.  Patient arrives here with stable vital signs and is afebrile.  Physical examination is unremarkable and he is neurologically intact.  Laboratory studies obtained including CBC and basic metabolic panel, both of which are unremarkable.  CT scan of the head shows no acute intracranial abnormality.  At this point,  patient is symptom-free and is neurologically intact.  His CAT scan is unremarkable and laboratory studies are normal.  Doubt TIA or CVA.  Also doubt Bell's palsy.  Patient to be discharged with as needed return.     Final diagnoses:  None    ED Discharge Orders     None          Geroldine Berg, MD 01/30/24 [phone]

## 2024-02-11 NOTE — Progress Notes (Signed)
 Office Visit Note  Patient: Derek Lee.             Date of Birth: Jul 22, 1975           MRN: [id number]             PCP: Macarthur Elouise SQUIBB, DO Referring: Lazoff, Shawn P, DO Visit Date: 02/24/2024   Subjective:  Pain (Right ankle and foot. )   Discussed the use of AI scribe software for clinical note transcription with the patient, who gave verbal consent to proceed.  History of Present Illness   Derek Lee. is a 48 y.o. male here for follow up for chronic gout now on allopurinol  450 mg daily and colchicine  as needed.   He has been experiencing significant knee pain over the past month, initially starting on one side and then switching to the other knee. The pain was severe but subsided after four to five days with the use of colchicine . He attributes this to dietary changes following a vacation and suspects it might be due to pseudogout, as it affects his knees and switches locations.  He describes recent ankle pain, which sometimes radiates and switches locations. The pain sometimes radiates and switches locations. He experiences pain in the tendons, especially after walking up a steep hill for his daughter's birthday, which he is not accustomed to. He believes this activity may have exacerbated his symptoms.  He mentions occasional elbow pain upon waking, which he attributes to his sleeping position. He has not had a steroid injection recently and prefers to avoid ibuprofen  due to its side effects, opting instead for colchicine  to manage pain.   Previous HPI 08/25/2023 Derek VEAR Mardy Lee. is a 48 y.o. male here for follow up for chronic gout now on allopurinol  450 mg daily and colchicine  as needed. He was referred by Dr. Elsa for evaluation of ankle pain and weakness. He had MRI studies demonstrating ligamentous tears but was more concerning for weakness. Subsequent neurology assessment and neuroimaging for other underlying causes.   He has ongoing muscle  weakness and balance issues, primarily affecting the left side, impacting his ability to stand for extended periods. MRIs of both feet in December led to a referral to a neurologist due to significant muscle weakness in his feet and ankles. Further tests, including spinal imaging, revealed some issues, though multiple sclerosis was not suggested by the radiology report.   He experiences tremors on the left side, contributing to his balance difficulties. These tremors have not been associated with any specific triggers or activities.   He has not yet started physical therapy to address his muscle weakness and balance issues, delaying this to await further neurological evaluation results.   He has a history of gout and is currently taking allopurinol  at a dose of 450 mg daily, which has effectively managed his symptoms, preventing flare-ups. He occasionally takes colchicine , approximately once a month, particularly if he anticipates dietary triggers or experiences early signs of a flare-up. His shoulder and elbow pain have improved since starting this regimen.   No recent illnesses and no significant pain since managing his gout effectively.      Previous HPI 01/06/2023 Derek VEAR Mardy Lee. is a 48 y.o. male here for follow up for chronic gout now on allopurinol  450 mg daily and colchicine  as needed.  Since her last visit he feels symptoms are overall doing much better has not suffered any serious flareups with the swelling heat or severe  pain as before.  Has noticed some increase in symptoms after activity such as eating a large amount of shellfish at the beach with some alcohol and takes colchicine  as needed when he starts to get pain.  Mobility remains limited by chronic pain and decreased mobility at his foot and ankle especially on the left side.     Previous HPI 08/08/2022 Derek VEAR Mardy Lee. is a 48 y.o. male here for follow up for inflammatory arthritis with history of chornic gout and  possible pseudogout. Lab testing at initial visit showed uric acid of 7.6 so allopurinol  was titrated to 300 mg daily. He had another flare up starting in the right elbow with warmth, swelling, and pain and started taking ibuprofen  800 mg for this then colchicine  0.6 mg 2 tablets daily and symptoms improved. Started to have some symptoms in the left elbow but without developing a full flare or swelling. Symptoms stayed improved with colchicine  daily for the following week.   Previous HPI 07/10/22 Derek Lee is a 48 y.o. male here for evaluation of positive ANA checked associated with recurrent joint pain in multiple areas particularly affecting him in bilateral feet knees elbows and left shoulder pain.  He has history of gout since more than 5 years ago episodes of pain and swelling that have been present in either foot but is currently on maintenance with 200 mg allopurinol  and feels like these types of flares are now infrequent only once or twice per year.  During about the 3 to 4 years he started having different joint pain with inflammation starting at 1 spot that would travel around for about 2 weeks total at a time.  This did not improve with colchicine  as he had typically seen with gout flares.  He takes ibuprofen  during these or gets steroid injection or oral taper which are beneficial.  He notices warmth pain and stiffness in the joints without much visible swelling.  During these episodes his foot or knee pain are so bad he cannot tolerate walking any significant distance and has to use a wheelchair or just does not move around at all for prolonged times.  More recently he is having joint soreness even outside of these flareups such that he cannot stand on his feet for more than maybe 1 hour at most and walking distance is limited.  Previous imaging demonstrated capsulitis in the foot as well as some midfoot degenerative changes and questionable peroneal tendinopathy.   Labs  reviewed 02/2022 ANA 1:40 DFS dsDNA, Scl-70, Sm, SSA, SSB neg RF neg CCP neg ESR 22 CRP 1 Uric acid 6.7 HLA-B27 neg    Review of Systems  Constitutional:  Negative for fatigue.  HENT:  Negative for mouth sores and mouth dryness.   Eyes:  Negative for dryness.  Respiratory:  Negative for shortness of breath.   Cardiovascular:  Negative for chest pain and palpitations.  Gastrointestinal:  Negative for blood in stool, constipation and diarrhea.  Endocrine: Negative for increased urination.  Genitourinary:  Negative for involuntary urination.  Musculoskeletal:  Positive for joint pain, gait problem, joint pain, joint swelling, muscle weakness, morning stiffness and muscle tenderness. Negative for myalgias and myalgias.  Skin:  Negative for color change, rash, hair loss and sensitivity to sunlight.  Allergic/Immunologic: Negative for susceptible to infections.  Neurological:  Negative for dizziness and headaches.  Hematological:  Negative for swollen glands.  Psychiatric/Behavioral:  Negative for depressed mood and sleep disturbance. The patient is not nervous/anxious.  PMFS History:  Patient Active Problem List   Diagnosis Date Noted   Arthritis of right ankle 02/24/2024   Inflammatory arthritis 08/08/2022   Idiopathic chronic gout, unspecified site, without tophus (tophi) 07/10/2022   Peroneal tendinitis, left 03/27/2022   Fracture of humeral shaft, right, closed 02/16/2017   Hyperlipidemia     Past Medical History:  Diagnosis Date   Asthma    Gout    Hyperlipidemia    Sleep apnea    noncompliant with cpap    Family History  Problem Relation Age of Onset   Diabetes Mother    High blood pressure Father    Past Surgical History:  Procedure Laterality Date   ORIF HUMERUS FRACTURE Right 02/16/2017   Procedure: OPEN REDUCTION INTERNAL FIXATION (ORIF) RIGHT HUMERAL SHAFT FRACTURE;  Surgeon: Cristy Bonner DASEN, MD;  Location: [facility];  Service:  Orthopedics;  Laterality: Right;   Social History   Social History Narrative   Right handed    There is no immunization history on file for this patient.   Objective: Vital Signs: BP 106/64   Pulse 80   Temp 97.8 F (36.6 C)   Resp 17   Ht 5' 7 (1.702 m)   BMI 34.46 kg/m    Physical Exam Constitutional:      Comments: In wheelchair  Eyes:     Conjunctiva/sclera: Conjunctivae normal.  Cardiovascular:     Rate and Rhythm: Normal rate and regular rhythm.  Pulmonary:     Effort: Pulmonary effort is normal.     Breath sounds: Normal breath sounds.  Musculoskeletal:     Right lower leg: Edema present.  Skin:    General: Skin is warm and dry.  Neurological:     Mental Status: He is alert.  Psychiatric:        Mood and Affect: Mood normal.      Musculoskeletal Exam:  Shoulders full ROM no tenderness or swelling Elbows full ROM no tenderness or swelling Wrists full ROM no tenderness or swelling Fingers full ROM no tenderness or swelling Knees full ROM no tenderness or swelling, right patellofemoral crepitus Ankles restrcited ROM b/l without tenderness to pressure or palpable effusions  Investigation: No additional findings.  Imaging: No results found.   Recent Labs: Lab Results  Component Value Date   WBC 9.7 01/29/2024   HGB 15.2 01/29/2024   PLT 279 01/29/2024   NA 137 02/24/2024   K 4.5 02/24/2024   CL 103 02/24/2024   CO2 25 02/24/2024   GLUCOSE 87 02/24/2024   BUN 13 02/24/2024   CREATININE 0.86 02/24/2024   BILITOT 0.4 08/25/2023   ALKPHOS 57 03/25/2022   AST 14 08/25/2023   ALT 16 08/25/2023   PROT 7.2 08/25/2023   ALBUMIN 4.2 03/25/2022   CALCIUM 9.6 02/24/2024   GFRAA >60 05/05/2015    Speciality Comments: No specialty comments available.  Procedures:  No procedures performed Allergies: Bee pollen   Assessment / Plan:     Visit Diagnoses: Idiopathic chronic gout of multiple sites without tophus - 08/25/2023 Uric Acid-6.3 - Plan:  Sedimentation rate, Uric acid, Basic Metabolic Panel (BMET) Chronic gout and pseudogout with recent knee flare-ups. Migratory pain and colchicine  ineffectiveness suggest pseudogout. Discussed gout vs. pseudogout, noting allopurinol 's ineffectiveness for pseudogout. Dehydration may trigger pseudogout flares. - Continue allopurinol  450 mg daily - Continue colchicine  for inflammation management. - Check uric acid levels. - Check sedimentation rate to assess inflammation.  Medication monitoring encounter - allopurinol  450 mg daily  and colchicine  as needed  Foot and ankle edema, right Right foot and ankle edema possibly due to fluid retention. Avoids ibuprofen  due to side effects, prefers colchicine  for pain.  Peroneal tendinitis, right ankle Peroneal tendinitis likely due to recent physical activity and overuse. Pain extends beyond joint due to tendon function.        Orders: Orders Placed This Encounter  Procedures   Sedimentation rate   Uric acid   Basic Metabolic Panel (BMET)   No orders of the defined types were placed in this encounter.    Follow-Up Instructions: Return in about 6 months (around 08/24/2024) for Gout on allopurinol /colchicine  f/u 6mos.   Lonni LELON Ester, MD  Note - This record has been created using [employer].  Chart creation errors have been sought, but may not always  have been located. Such creation errors do not reflect on  the standard of medical care.

## 2024-02-24 ENCOUNTER — Ambulatory Visit: Attending: Internal Medicine | Admitting: Internal Medicine

## 2024-02-24 ENCOUNTER — Encounter: Payer: Self-pay | Admitting: Internal Medicine

## 2024-02-24 VITALS — BP 106/64 | HR 80 | Temp 97.8°F | Resp 17 | Ht 67.0 in

## 2024-02-24 DIAGNOSIS — Z5181 Encounter for therapeutic drug level monitoring: Secondary | ICD-10-CM | POA: Insufficient documentation

## 2024-02-24 DIAGNOSIS — M19071 Primary osteoarthritis, right ankle and foot: Secondary | ICD-10-CM | POA: Insufficient documentation

## 2024-02-24 DIAGNOSIS — M1A09X0 Idiopathic chronic gout, multiple sites, without tophus (tophi): Secondary | ICD-10-CM | POA: Insufficient documentation

## 2024-02-25 LAB — BASIC METABOLIC PANEL WITH GFR
BUN: 13 mg/dL (ref 7–25)
CO2: 25 mmol/L (ref 20–32)
Calcium: 9.6 mg/dL (ref 8.6–10.3)
Chloride: 103 mmol/L (ref 98–110)
Creat: 0.86 mg/dL (ref 0.60–1.29)
Glucose, Bld: 87 mg/dL (ref 65–139)
Potassium: 4.5 mmol/L (ref 3.5–5.3)
Sodium: 137 mmol/L (ref 135–146)
eGFR: 107 mL/min/1.73m2 (ref >=60)

## 2024-02-25 LAB — URIC ACID: Uric Acid, Serum: 5.9 mg/dL (ref 4.0–8.0)

## 2024-02-25 LAB — SEDIMENTATION RATE: Sed Rate: 9 mm/h (ref 0–15)

## 2024-08-24 ENCOUNTER — Ambulatory Visit: Admitting: Internal Medicine
